# Patient Record
Sex: Female | Born: 1935 | Race: White | Hispanic: No
Health system: Southern US, Community
[De-identification: ages and names within clinical notes are randomized; demographics above are authoritative.]

---

## 2000-02-01 ENCOUNTER — Encounter (INDEPENDENT_AMBULATORY_CARE_PROVIDER_SITE_OTHER): Payer: Self-pay

## 2000-02-01 ENCOUNTER — Other Ambulatory Visit: Admission: RE | Admit: 2000-02-01 | Discharge: 2000-02-01 | Payer: Self-pay | Admitting: Obstetrics and Gynecology

## 2002-09-16 ENCOUNTER — Ambulatory Visit (HOSPITAL_COMMUNITY): Admission: RE | Admit: 2002-09-16 | Discharge: 2002-09-16 | Payer: Self-pay | Admitting: Internal Medicine

## 2003-02-24 ENCOUNTER — Encounter: Payer: Self-pay | Admitting: Internal Medicine

## 2003-02-24 ENCOUNTER — Ambulatory Visit (HOSPITAL_COMMUNITY): Admission: RE | Admit: 2003-02-24 | Discharge: 2003-02-24 | Payer: Self-pay | Admitting: Internal Medicine

## 2003-12-13 ENCOUNTER — Ambulatory Visit: Admission: RE | Admit: 2003-12-13 | Discharge: 2003-12-13 | Payer: Self-pay | Admitting: Gynecology

## 2003-12-27 ENCOUNTER — Encounter (INDEPENDENT_AMBULATORY_CARE_PROVIDER_SITE_OTHER): Payer: Self-pay | Admitting: Specialist

## 2003-12-27 ENCOUNTER — Inpatient Hospital Stay (HOSPITAL_COMMUNITY): Admission: RE | Admit: 2003-12-27 | Discharge: 2003-12-29 | Payer: Self-pay | Admitting: Gynecology

## 2004-01-03 ENCOUNTER — Ambulatory Visit: Admission: RE | Admit: 2004-01-03 | Discharge: 2004-01-03 | Payer: Self-pay | Admitting: Gynecology

## 2004-01-24 ENCOUNTER — Ambulatory Visit: Admission: RE | Admit: 2004-01-24 | Discharge: 2004-01-24 | Payer: Self-pay | Admitting: Gynecology

## 2004-01-25 ENCOUNTER — Ambulatory Visit: Admission: RE | Admit: 2004-01-25 | Discharge: 2004-04-24 | Payer: Self-pay | Admitting: Radiation Oncology

## 2004-02-22 ENCOUNTER — Ambulatory Visit: Admission: RE | Admit: 2004-02-22 | Discharge: 2004-02-22 | Payer: Self-pay | Admitting: Gynecology

## 2004-03-15 ENCOUNTER — Inpatient Hospital Stay (HOSPITAL_COMMUNITY): Admission: EM | Admit: 2004-03-15 | Discharge: 2004-03-20 | Payer: Self-pay | Admitting: Emergency Medicine

## 2004-04-04 ENCOUNTER — Ambulatory Visit: Admission: RE | Admit: 2004-04-04 | Discharge: 2004-04-04 | Payer: Self-pay | Admitting: Gynecology

## 2004-05-15 ENCOUNTER — Ambulatory Visit: Admission: RE | Admit: 2004-05-15 | Discharge: 2004-05-15 | Payer: Self-pay | Admitting: Radiation Oncology

## 2004-06-20 ENCOUNTER — Encounter (INDEPENDENT_AMBULATORY_CARE_PROVIDER_SITE_OTHER): Payer: Self-pay | Admitting: *Deleted

## 2004-06-20 ENCOUNTER — Ambulatory Visit: Admission: RE | Admit: 2004-06-20 | Discharge: 2004-06-20 | Payer: Self-pay | Admitting: Gynecology

## 2004-06-20 ENCOUNTER — Other Ambulatory Visit: Admission: RE | Admit: 2004-06-20 | Discharge: 2004-06-20 | Payer: Self-pay | Admitting: Gynecology

## 2004-08-21 ENCOUNTER — Ambulatory Visit: Admission: RE | Admit: 2004-08-21 | Discharge: 2004-08-21 | Payer: Self-pay | Admitting: Radiation Oncology

## 2004-09-07 ENCOUNTER — Ambulatory Visit: Admission: RE | Admit: 2004-09-07 | Discharge: 2004-09-07 | Payer: Self-pay | Admitting: Gynecology

## 2004-09-07 ENCOUNTER — Encounter (INDEPENDENT_AMBULATORY_CARE_PROVIDER_SITE_OTHER): Payer: Self-pay | Admitting: *Deleted

## 2004-10-24 ENCOUNTER — Ambulatory Visit: Admission: RE | Admit: 2004-10-24 | Discharge: 2004-10-24 | Payer: Self-pay | Admitting: Gynecology

## 2004-11-29 ENCOUNTER — Ambulatory Visit: Admission: RE | Admit: 2004-11-29 | Discharge: 2005-02-27 | Payer: Self-pay | Admitting: Radiation Oncology

## 2005-01-25 ENCOUNTER — Ambulatory Visit (HOSPITAL_COMMUNITY): Admission: RE | Admit: 2005-01-25 | Discharge: 2005-01-25 | Payer: Self-pay | Admitting: Internal Medicine

## 2005-01-29 ENCOUNTER — Other Ambulatory Visit: Admission: RE | Admit: 2005-01-29 | Discharge: 2005-01-29 | Payer: Self-pay | Admitting: Radiation Oncology

## 2005-02-07 ENCOUNTER — Encounter (HOSPITAL_COMMUNITY): Admission: RE | Admit: 2005-02-07 | Discharge: 2005-03-09 | Payer: Self-pay | Admitting: Radiation Oncology

## 2005-03-26 ENCOUNTER — Ambulatory Visit (HOSPITAL_COMMUNITY): Admission: RE | Admit: 2005-03-26 | Discharge: 2005-03-26 | Payer: Self-pay | Admitting: Radiation Oncology

## 2005-04-23 ENCOUNTER — Ambulatory Visit: Admission: RE | Admit: 2005-04-23 | Discharge: 2005-04-23 | Payer: Self-pay | Admitting: Gynecology

## 2005-04-23 ENCOUNTER — Encounter (INDEPENDENT_AMBULATORY_CARE_PROVIDER_SITE_OTHER): Payer: Self-pay | Admitting: *Deleted

## 2005-07-18 ENCOUNTER — Ambulatory Visit: Payer: Self-pay | Admitting: Psychiatry

## 2005-08-23 ENCOUNTER — Ambulatory Visit: Payer: Self-pay | Admitting: Psychiatry

## 2005-10-16 ENCOUNTER — Other Ambulatory Visit: Admission: RE | Admit: 2005-10-16 | Discharge: 2005-10-16 | Payer: Self-pay | Admitting: Radiation Oncology

## 2005-10-22 ENCOUNTER — Ambulatory Visit: Admission: RE | Admit: 2005-10-22 | Discharge: 2005-10-22 | Payer: Self-pay | Admitting: Gynecology

## 2005-11-05 ENCOUNTER — Encounter (INDEPENDENT_AMBULATORY_CARE_PROVIDER_SITE_OTHER): Payer: Self-pay | Admitting: *Deleted

## 2005-11-05 ENCOUNTER — Ambulatory Visit (HOSPITAL_COMMUNITY): Admission: RE | Admit: 2005-11-05 | Discharge: 2005-11-05 | Payer: Self-pay | Admitting: Gynecology

## 2005-11-19 ENCOUNTER — Ambulatory Visit: Admission: RE | Admit: 2005-11-19 | Discharge: 2005-11-19 | Payer: Self-pay | Admitting: Gynecologic Oncology

## 2005-12-11 ENCOUNTER — Ambulatory Visit: Admission: RE | Admit: 2005-12-11 | Discharge: 2005-12-11 | Payer: Self-pay | Admitting: Gynecology

## 2006-01-21 ENCOUNTER — Ambulatory Visit: Admission: RE | Admit: 2006-01-21 | Discharge: 2006-01-21 | Payer: Self-pay | Admitting: Gynecology

## 2006-03-04 ENCOUNTER — Ambulatory Visit: Admission: RE | Admit: 2006-03-04 | Discharge: 2006-03-04 | Payer: Self-pay | Admitting: Gynecologic Oncology

## 2006-03-25 ENCOUNTER — Ambulatory Visit: Payer: Self-pay | Admitting: Internal Medicine

## 2006-03-31 ENCOUNTER — Ambulatory Visit: Payer: Self-pay | Admitting: Internal Medicine

## 2006-03-31 ENCOUNTER — Ambulatory Visit (HOSPITAL_COMMUNITY): Admission: RE | Admit: 2006-03-31 | Discharge: 2006-03-31 | Payer: Self-pay | Admitting: Internal Medicine

## 2006-04-22 ENCOUNTER — Ambulatory Visit: Admission: RE | Admit: 2006-04-22 | Discharge: 2006-04-22 | Payer: Self-pay | Admitting: Gynecologic Oncology

## 2006-04-22 ENCOUNTER — Encounter (INDEPENDENT_AMBULATORY_CARE_PROVIDER_SITE_OTHER): Payer: Self-pay | Admitting: *Deleted

## 2006-06-17 ENCOUNTER — Ambulatory Visit: Admission: RE | Admit: 2006-06-17 | Discharge: 2006-06-17 | Payer: Self-pay | Admitting: Gynecologic Oncology

## 2006-06-17 ENCOUNTER — Encounter (INDEPENDENT_AMBULATORY_CARE_PROVIDER_SITE_OTHER): Payer: Self-pay | Admitting: *Deleted

## 2006-07-08 ENCOUNTER — Ambulatory Visit (HOSPITAL_COMMUNITY): Admission: RE | Admit: 2006-07-08 | Discharge: 2006-07-08 | Payer: Self-pay | Admitting: Gynecology

## 2006-07-08 ENCOUNTER — Encounter (INDEPENDENT_AMBULATORY_CARE_PROVIDER_SITE_OTHER): Payer: Self-pay | Admitting: *Deleted

## 2006-07-15 ENCOUNTER — Ambulatory Visit: Admission: RE | Admit: 2006-07-15 | Discharge: 2006-07-15 | Payer: Self-pay | Admitting: Gynecologic Oncology

## 2006-07-22 ENCOUNTER — Ambulatory Visit: Admission: RE | Admit: 2006-07-22 | Discharge: 2006-07-22 | Payer: Self-pay | Admitting: Gynecologic Oncology

## 2006-09-05 ENCOUNTER — Ambulatory Visit: Admission: RE | Admit: 2006-09-05 | Discharge: 2006-09-05 | Payer: Self-pay | Admitting: Gynecology

## 2006-12-03 ENCOUNTER — Ambulatory Visit: Admission: RE | Admit: 2006-12-03 | Discharge: 2006-12-03 | Payer: Self-pay | Admitting: Gynecology

## 2007-02-20 ENCOUNTER — Ambulatory Visit: Admission: RE | Admit: 2007-02-20 | Discharge: 2007-02-20 | Payer: Self-pay | Admitting: Gynecology

## 2007-02-25 ENCOUNTER — Ambulatory Visit (HOSPITAL_COMMUNITY): Admission: RE | Admit: 2007-02-25 | Discharge: 2007-02-25 | Payer: Self-pay | Admitting: Gynecologic Oncology

## 2007-02-25 ENCOUNTER — Encounter (INDEPENDENT_AMBULATORY_CARE_PROVIDER_SITE_OTHER): Payer: Self-pay | Admitting: Specialist

## 2007-03-09 ENCOUNTER — Ambulatory Visit: Admission: RE | Admit: 2007-03-09 | Discharge: 2007-05-21 | Payer: Self-pay | Admitting: Radiation Oncology

## 2007-03-18 ENCOUNTER — Ambulatory Visit (HOSPITAL_COMMUNITY): Admission: RE | Admit: 2007-03-18 | Discharge: 2007-03-18 | Payer: Self-pay | Admitting: Radiation Oncology

## 2007-04-07 ENCOUNTER — Ambulatory Visit (HOSPITAL_COMMUNITY): Admission: RE | Admit: 2007-04-07 | Discharge: 2007-04-07 | Payer: Self-pay | Admitting: Radiation Oncology

## 2007-08-27 IMAGING — CT CT ABDOMEN W/ CM
2 of 5 series · 16 of 46 positions shown, 18 images · IV contrast (omnipaque)
Comparison: None.

CLINICAL DATA: Evaluate for recurrent vulvar CA.  Reason for exam:  Question mets to abdomen/pelvis.  
ABDOMEN CT WITH CONTRAST:
TECHNIQUE: Multidetector CT imaging of the abdomen was performed following the standard protocol during bolus administration of intravenous contrast.
Contrast:  125 cc Omnipaque 300 and oral gastrografin.
TECHNIQUE: Multidetector CT imaging of the pelvis was performed following the standard protocol during bolus administration of intravenous contrast.

[Series 2: abd_pel 5.0 b40s · axial · 0.74mm/px · z∈[-434,-39]mm · 13 of 89 slices shown, 15 images]
[im 5/89  soft-tissue]
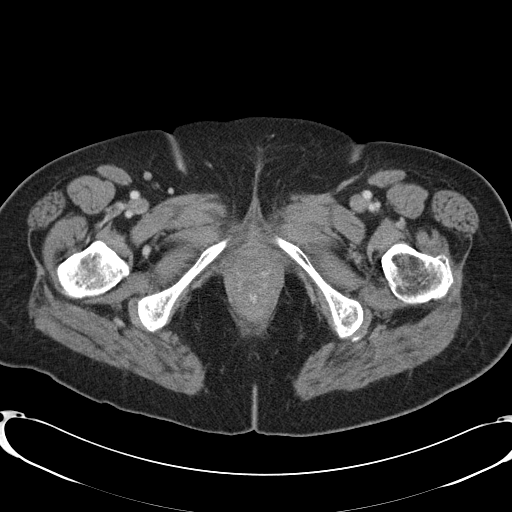
[im 5/89  bone]
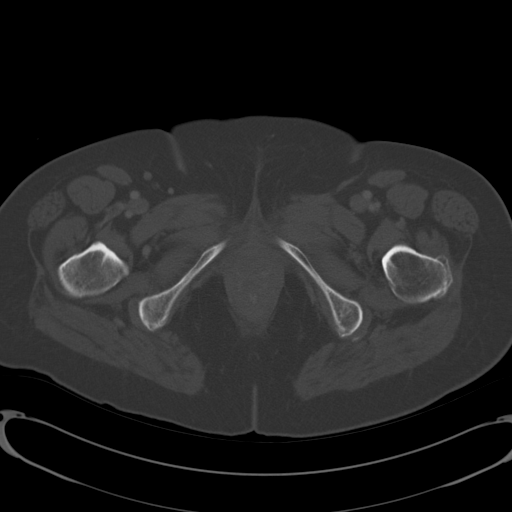
[im 14/89  soft-tissue]
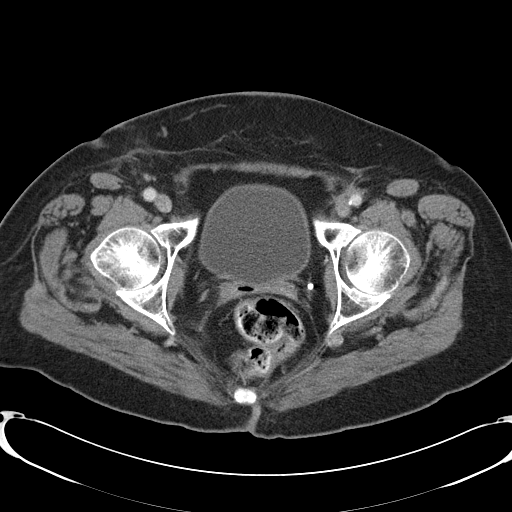
[im 19/89  soft-tissue]
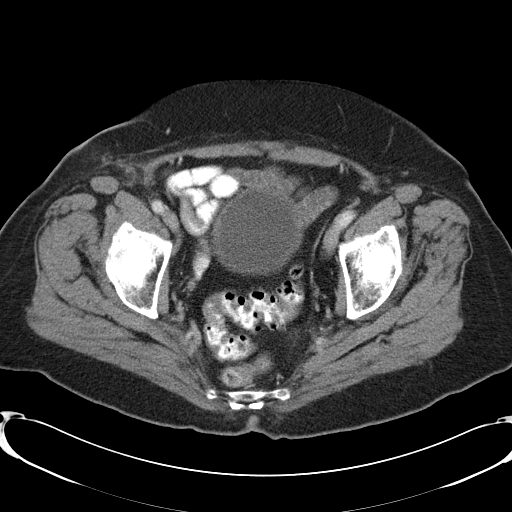
[im 24/89  soft-tissue]
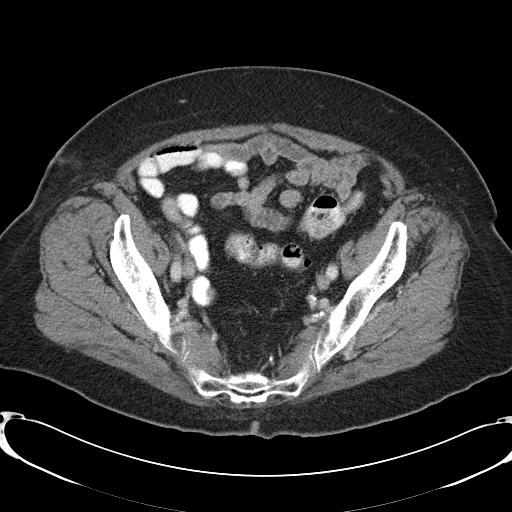
[im 33/89  soft-tissue]
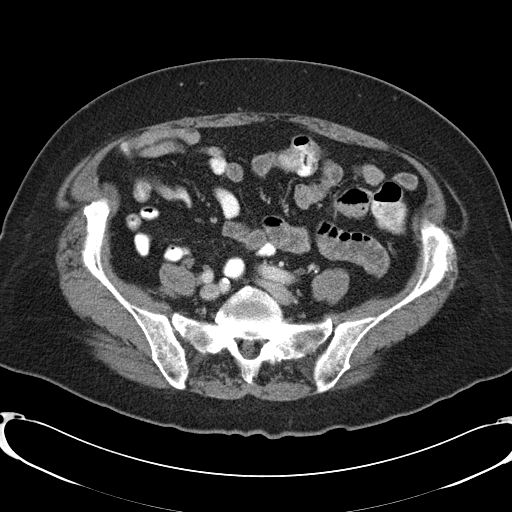
[im 38/89  soft-tissue]
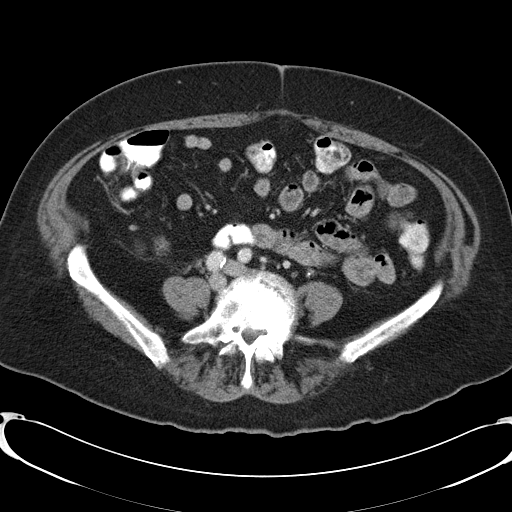
[im 47/89  soft-tissue]
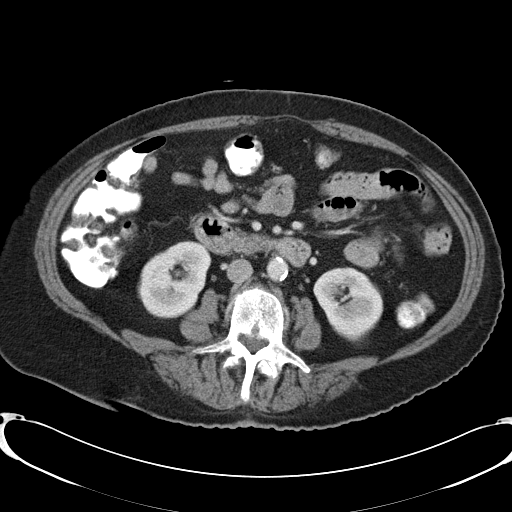
[im 51/89  soft-tissue]
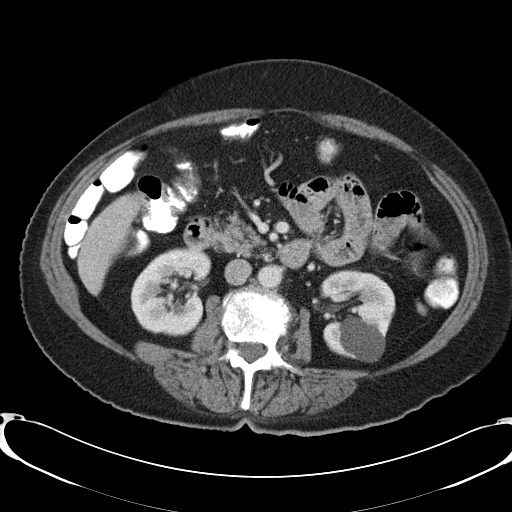
[im 56/89  soft-tissue]
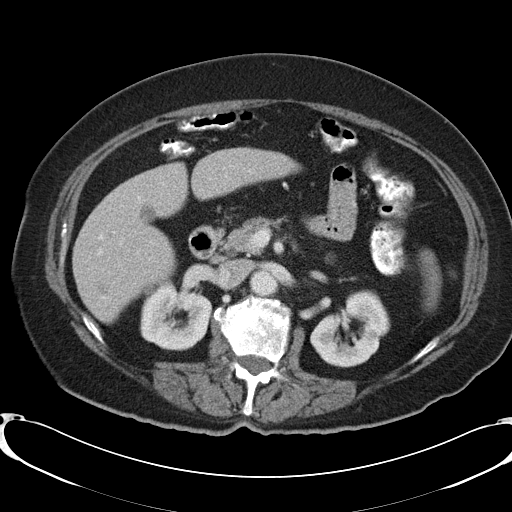
[im 56/89  bone]
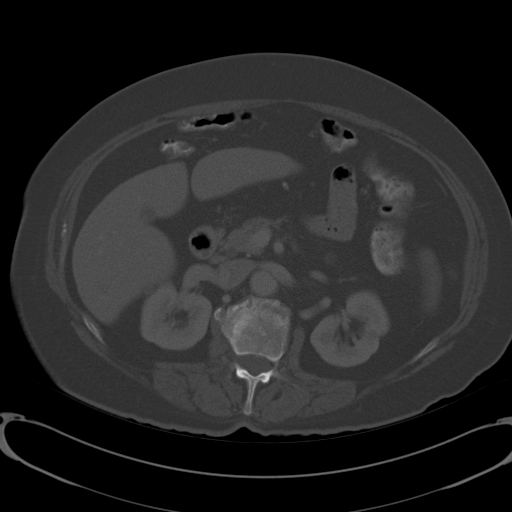
[im 65/89  soft-tissue]
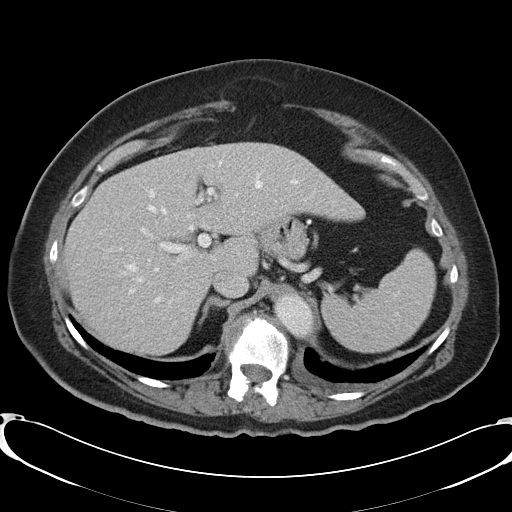
[im 70/89  soft-tissue]
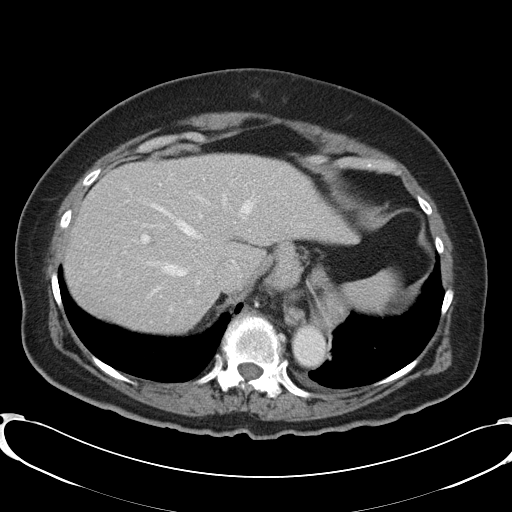
[im 75/89  soft-tissue]
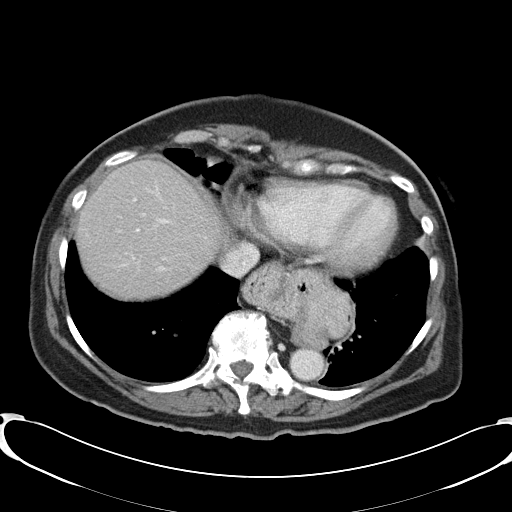
[im 84/89  soft-tissue]
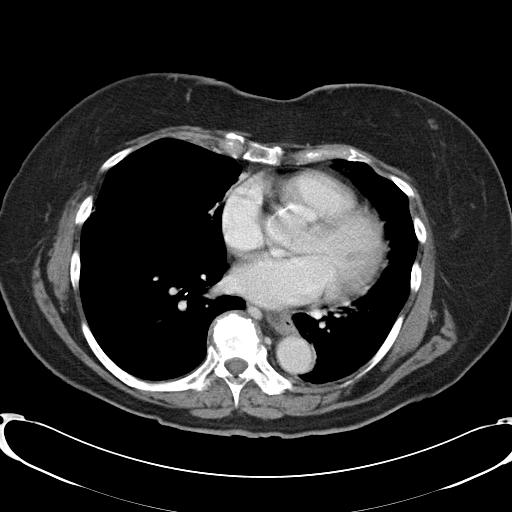

[Series 602: <mpr thick range> · coronal · 0.87mm/px · 3 of 78 slices shown]
[im 26/78  soft-tissue]
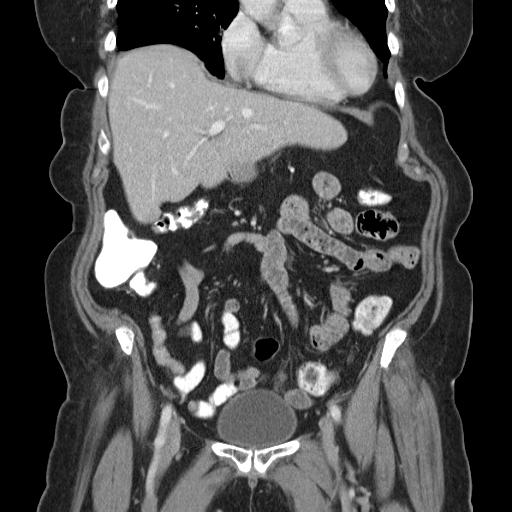
[im 35/78  soft-tissue]
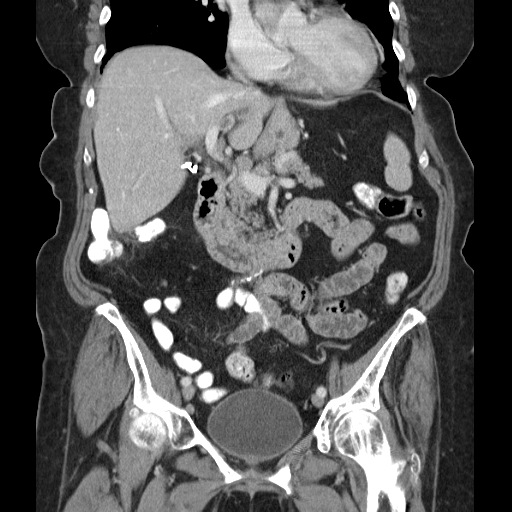
[im 43/78  soft-tissue]
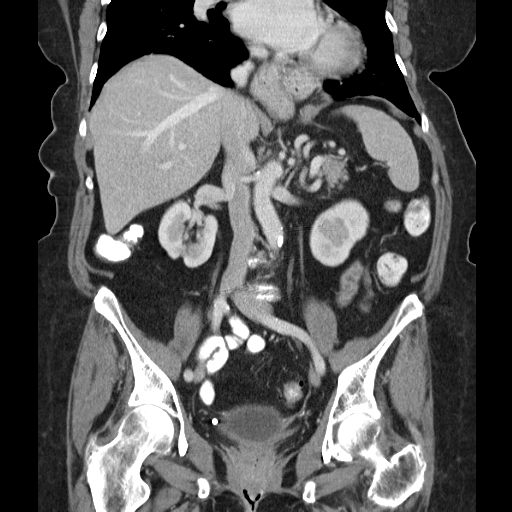

[16 of 46 positions shown; findings below may reference images not displayed]

FINDINGS: Small pleural effusion left posterior costophrenic angle.  Large hiatal hernia.  Enlarged node(s) in the porta hepatis just anteromedial to the portal vein with a short axis diameter of 1.3 cm.  No evidence of retroperitoneal adenopathy and no evidence of pelvic adenopathy (see pelvic CT report) which would be somewhat unusual for metastatic disease from vulvar CA, however metastatic disease cannot be excluded with certainty.  Hyperplastic node is a consideration.  1.5 cm cyst in the inferior aspect of the posterior segment of the right hepatic lobe.  In the posterior lateral aspect of the medial segment of the left hepatic lobe (see transaxial images 32 ? 35) there is an approximate 1.6 x 1.7 x 1.9 cm low density focus which does not represent a cyst.  Major differential considerations are liver metastasis versus focal fatty infiltration simulating a lesion.  Consider MRI for further assessment in that regard.  Spleen, pancreas, adrenal glands, and kidneys are unremarkable.  2.8 x 4.0 cm cyst posterior aspect of the left kidney.   Extensive degenerative spondylotic changes lower thoracic and lumbar spine.  Negative for metastatic bone disease.
IMPRESSION: Porta hepatis region adenopathy.  Metastatic disease cannot be excluded ? see comments above.  Focal hypodense focus in the liver ? major considerations are metastatic disease versus focal fatty infiltration of the liver.  If clinically desired, MRI would be helpful for further imaging assessment.  
PELVIS CT WITH CONTRAST:
FINDINGS: Hysterectomy.  Mild diffuse bladder wall thickening may be due to chronic/remote cystitis.  Sigmoid colon diverticula.  No pathologically enlarged pelvic lymph nodes.  Bony pelvis intact.
IMPRESSION: No pelvic mass or adenopathy.  Negative for metastatic disease or recurrent/residual carcinoma of the pelvis.

## 2007-11-06 ENCOUNTER — Ambulatory Visit (HOSPITAL_COMMUNITY): Admission: RE | Admit: 2007-11-06 | Discharge: 2007-11-06 | Payer: Self-pay | Admitting: Gynecology

## 2007-11-10 ENCOUNTER — Ambulatory Visit: Admission: RE | Admit: 2007-11-10 | Discharge: 2007-11-10 | Payer: Self-pay | Admitting: Gynecologic Oncology

## 2007-11-10 ENCOUNTER — Encounter (INDEPENDENT_AMBULATORY_CARE_PROVIDER_SITE_OTHER): Payer: Self-pay | Admitting: Gynecologic Oncology

## 2007-11-16 ENCOUNTER — Ambulatory Visit (HOSPITAL_COMMUNITY): Admission: RE | Admit: 2007-11-16 | Discharge: 2007-11-16 | Payer: Self-pay | Admitting: Radiation Oncology

## 2007-12-08 ENCOUNTER — Encounter: Payer: Self-pay | Admitting: Gynecology

## 2007-12-08 ENCOUNTER — Ambulatory Visit (HOSPITAL_COMMUNITY): Admission: RE | Admit: 2007-12-08 | Discharge: 2007-12-08 | Payer: Self-pay | Admitting: Gynecology

## 2008-01-19 ENCOUNTER — Ambulatory Visit: Admission: RE | Admit: 2008-01-19 | Discharge: 2008-01-19 | Payer: Self-pay | Admitting: Gynecology

## 2008-02-26 ENCOUNTER — Inpatient Hospital Stay (HOSPITAL_COMMUNITY): Admission: EM | Admit: 2008-02-26 | Discharge: 2008-03-01 | Payer: Self-pay | Admitting: Emergency Medicine

## 2008-02-27 ENCOUNTER — Ambulatory Visit: Payer: Self-pay | Admitting: Critical Care Medicine

## 2008-02-27 ENCOUNTER — Ambulatory Visit: Payer: Self-pay | Admitting: Cardiology

## 2008-02-28 ENCOUNTER — Encounter: Payer: Self-pay | Admitting: Pulmonary Disease

## 2008-02-29 ENCOUNTER — Encounter (INDEPENDENT_AMBULATORY_CARE_PROVIDER_SITE_OTHER): Payer: Self-pay | Admitting: Pulmonary Disease

## 2008-02-29 ENCOUNTER — Ambulatory Visit: Payer: Self-pay | Admitting: Infectious Diseases

## 2011-01-12 ENCOUNTER — Encounter: Payer: Self-pay | Admitting: Internal Medicine

## 2011-01-13 ENCOUNTER — Encounter: Payer: Self-pay | Admitting: Internal Medicine

## 2011-05-07 NOTE — Consult Note (Signed)
[name], THORNELL            ACCOUNT NO.:  [id number]   MEDICAL RECORD NO.:  [id number]          PATIENT TYPE:  OUT   LOCATION:  GYN                          FACILITY:  [facility]   PHYSICIAN:  John T. Kyla Balzarine, M.D.    DATE OF BIRTH:  12/14/1927   DATE OF CONSULTATION:  11/10/2007  DATE OF DISCHARGE:                                 CONSULTATION   CHIEF COMPLAINT:  Followup of vulvar cancer with concern for new vulvar  lesion.   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  She had left inguinal radiation including the vulva and a whole pelvic  field.  She had radiation vulvitis.  In November 2006, she had a right  vulvar recurrence.  Following resection, she had vulvar wound breakdown.  Recurrence in May 2007 in the left posterior vulva and right vulva were  treated with resections, but inguinal lymphadenectomy was not performed.  She subsequently developed an enlarged right groin node which was  positive on resection and received right groin radiation.  She underwent  restaging MRI scans because of incidentally noted portal nodes and a  lung nodule and complained to [place] yesterday that she had noted a new  lesion of the right anterior vulva that was uncomfortable.   PAST MEDICAL HISTORY:  1. Hypercholesterolemia.  2. Hypertension.  3. Adult onset diabetes.   ALLERGIES:  None   MEDICATIONS:  Atenolol, Xanax, unknown antihypertensive   FAMILY HISTORY:  Noncontributory without gynecologic, breast or colon  cancer.   PERSONAL AND SOCIAL HISTORY:  Widowed and denies tobacco or significant  ethanol use.   REVIEW OF SYSTEMS:  On 10-point Review of Systems, the patient does have  chronic diarrhea.  She is extremely anxious over the findings on today's  examination.  Otherwise, essentially negative, with stable  weight and  appetite.   PHYSICAL EXAMINATION:  VITAL SIGNS:  Weight 176 pounds. Blood pressure  118/80.  GENERAL:  The patient is anxious, alert and oriented x3 in no acute  distress.  LYMPH:  No palpably enlarged lymph nodes on lymph node survey.  ABDOMEN:  Soft and benign.  BACK:  No spinous or CVA tenderness.  EXTREMITIES:  Chronic edema bilaterally.  PELVIC:  Examination the groins and vulva reveals chronic radiation  changes and hyperkeratosis in the crural folds.  In the right anterior  vulva at approximately the level of the urethra at the clitoris, mid  labia majora is a suspicious 2 cm lesion measuring at least 5 mm in  thickness.  After informed consent, this was injected with Xylocaine,  and a punch biopsy obtained and submitted for pathology.   ASSESSMENT:  Likely recurrent vulvar cancer.   PLAN:  The patient will likely require wide local excision.      John T. Kyla Balzarine, M.D.  Electronically Signed     JTS/MEDQ  D:  11/10/2007  T:  11/11/2007  Job:  [id number]   cc:   Kingsley Callander. Ouida Sills, MD  Fax:  [phone]   S. Kyra Manges, M.D.  Fax: [phone]   Billie Lade, M.D.  Fax: [phone]   Telford Nab, R.N.  [address]  [place], [place] [address]

## 2011-05-07 NOTE — H&P (Signed)
[name], [name]            ACCOUNT NO.:  [id number]   MEDICAL RECORD NO.:  [id number]          PATIENT TYPE:  INP   LOCATION:  IC07                          FACILITY:  [facility]   PHYSICIAN:  Kingsley Callander. Ouida Sills, MD       DATE OF BIRTH:  27-Feb-1927   DATE OF ADMISSION:  02/25/2008  DATE OF DISCHARGE:  LH                              HISTORY & PHYSICAL   CHIEF COMPLAINT:  Vomiting and diarrhea.   HISTORY OF PRESENT ILLNESS:  This patient is a 77 year old white female  resident of [facility] who was transferred to the emergency room last night  after she developed a low blood pressure.  She was initially hypotensive  in the ER.  Blood pressure was measured at 92/47 at the ER and then  dropped to 67/42.  She was febrile to 102.7.  She had had multiple bouts  of loose stools.  She had been treated last month for Clostridium  difficile associated colitis with Flagyl.  She had been diagnosed with a  recurrence earlier this week.  She was started on IV fluids at the  nursing home along with Flagyl on March 3.  She had been transferred to  [facility] in January following a bilateral valvulectomy with a rhomboid  flap for recurrent vulvar carcinoma.  She had received cephalexin over  that period.  She was evaluated in the emergency room with a CT scan  which revealed severe colitis.  There is no definite evidence of  perforation.  She was admitted and ultimately transferred to the  intensive care unit due to hypotension.  She was treated with IV fluids.  She was treated with dopamine and then Levophed.  She had a marked  leukocytosis.   PAST MEDICAL HISTORY:  1. Vulvar carcinoma  2. Coronary artery disease  3. Hypertension  4. Hyperlipidemia  5. Impaired fasting glucose.  6. Gastroesophageal reflux disease.  7. Mild dementia.  8. Depression.  9. Anxiety with panic attacks.  10.Hysterectomy  11.Hiatal hernia  12.Cholecystectomy.   MEDICATIONS:  1. Atenolol 50 mg daily  2. Celexa 40 mg daily  3. Vasotec 10 mg daily  4. Omeprazole 20 mg daily  5. Multivitamin daily  6. Simvastatin 80 mg daily  7. Trazodone 100 mg q.h.s.  8. Imodium A-D p.r.n.  9. Pro-Stat 30 mL b.i.d.   ALLERGIES:  None.   SOCIAL HISTORY:  She does not use tobacco or alcohol.   FAMILY HISTORY:  Her mother had Alzheimer's disease and COPD.  Her  father had prostate cancer.   REVIEW OF SYSTEMS:  She has been able to eat or drink very little over  the last several days.  She has vomited several times and has felt  nauseated.  She denies abdominal pain.  She has had multiple loose  stools.  She has not had bleeding.  She has had progressive wound  healing and is no longer requiring any packing of her vulvar area.   PHYSICAL EXAM:  Blood pressure 105/70, pulse 110, maximal temperature  102.7, respirations 24.  GENERAL:  Alert and oriented and in no acute distress.  She appears very  pale.  HEENT: No scleral icterus.  She has a benign skin mass on the right chin  which is chronic.  Oropharynx reveals fair moisture.  She has an upper  denture.  NECK: No JVD or thyromegaly or lymphadenopathy.  LUNGS: Clear.  HEART:  Tachycardia with a grade 1 systolic murmur.  ABDOMEN: Soft and nontender with no hepatosplenomegaly.  GU: She has a Foley catheter which is showing some wear and is being  replaced. EXTREMITIES:  No cyanosis, clubbing or edema  NEURO:  Generally weak.  No focal deficits  LYMPH NODES:  No cervical or supraclavicular enlargement.   LABORATORY DATA:  White count 26.3, hemoglobin 12.4, platelets 384,000,  86 segs, seven lymphs with greater than 20% bands.  Sodium 134,  potassium 3.5, bicarb 24, glucose 162, BUN 17, creatinine 1.49, calcium  8.1, lipase 16, albumin 1.9, SGOT 20, SGPT 8.  Chest x-ray reveals a suboptimal inspiration with bibasilar atelectasis,  large hiatal hernia and stable mild cardiomegaly.   IMPRESSION:  1. Clostridium difficile colitis.  She is being treated with IV fluids       and Levophed.  She will be treated with oral vancomycin and IV      metronidazole.  Hopefully she will respond to antibiotic therapy      but if necessary surgical consultation will be obtained.  She has      severe colitis by CT.  2. Vulvar carcinoma followed by Dr. Loree Fee.  3. Coronary artery disease.  4. Hypertension  5. Hyperlipidemia.  6. GERD.  7. Depression/anxiety.      Kingsley Callander. Ouida Sills, MD  Electronically Signed    ROF/MEDQ  D:  02/26/2008  T:  02/26/2008  Job:  [id number]

## 2011-05-07 NOTE — Consult Note (Signed)
NAMEHARMONIE, [name] NO.:  [id number]   MEDICAL RECORD NO.:  [id number]          PATIENT TYPE:  OUT   LOCATION:  GYN                          [facility]ACILITY:  [facility]   PHYSICIAN:  De Blanch, M.D.DATE OF BIRTH:  29-May-1927   DATE OF CONSULTATION:  01/19/2008  DATE OF DISCHARGE:  01/19/2008                                 CONSULTATION   CHIEF COMPLAINT:  Postoperative follow-up.   INTERVAL HISTORY:  The patient returns today for further evaluation of  her vulvar wound.  She has been having this packed at the nursing home  and seems to be tolerating the packing well.  She denies any fever,  chills or any vaginal bleeding.  She was seen 2 weeks ago by Dr. Kyla Balzarine.   PHYSICAL EXAM:  Weight 165 pounds.  ABDOMEN:  Soft, nontender.  There is no inguinal adenopathy.  PELVIC:  EGBUS shows that the area of wound breakdown is granulating  well and is only approximately 3 x 2 cm.  There is no evidence of  erythema or infection.   IMPRESSION:  Continued improvement of her vulvar wound. She will  continue dressing changes as is being performed at the nursing home.  She will return to see [place] in approximately 3-4 weeks.      De Blanch, M.D.  Electronically Signed     DC/MEDQ  D:  01/21/2008  T:  01/21/2008  Job:  [id number]

## 2011-05-07 NOTE — Discharge Summary (Signed)
[name], [name]            ACCOUNT NO.:  [id number]   MEDICAL RECORD NO.:  [id number]           PATIENT TYPE:  INP   LOCATION:  IC07                          FACILITY:  [facility]   PHYSICIAN:  Kingsley Callander. Ouida Sills, MD       DATE OF BIRTH:  [birth year]   DATE OF ADMISSION:  02/26/2008  DATE OF DISCHARGE:  03/07/2009LH                               DISCHARGE SUMMARY   DISCHARGE DIAGNOSES:  1. Clostridium difficile colitis.  2. Acute renal failure.  3. Shock.  4. Metabolic acidosis.  5. Vulvar carcinoma.  6. Coronary artery disease.  7. Hypertension.  8. Hyperlipidemia.  9. Impaired fasting glucose.  10.Gastroesophageal reflux disease.  11.Mild dementia.  12.Depression/anxiety.   PROCEDURES:  1. PICC line placement.  2. CT of the abdomen and pelvis.   HOSPITAL COURSE:  This patient is a 77 year old female who was admitted  to the ICU with hypotension, diarrhea, nausea and vomiting.  She had  been treated at her nursing home for Clostridium difficile colitis with  Flagyl.  She had become hypotensive though.  She was treated with IV  fluids and Levophed.  Despite large volumes of fluids, she has had  minimal urine output with associated acute renal failure with a rise in  her creatinine from 1.4 to 2.2.  She had a metabolic acidosis with a pH  of 7.35, pCO2 of 27, pO2 of 68 and a bicarbonate of 14 on 50% FIO2.   A repeat Clostridium difficile toxin is positive.   Assistance has been received from [facility] physicians.  The decision has  now been made to transfer her to [facility] for additional critical care  assistance.  She may require a surgical consultation and a repeat CT  scan of the abdomen.  The initial CT scan without contrast revealed  severe colitis, but no sign of perforation.   Blood cultures are negative.   The patient remains alert and oriented.  Blood pressures have been low  normal now on 11 mcg of Levophed.   DISCHARGE MEDICATIONS:  1. Vancomycin 125 mg p.o. q.6h.  2.  Flagyl 500 mg IV q.8h.  3. Lovenox 40 mg q.24h.  4. Celexa 40 mg daily.  5. Multivitamin daily.  6. Protonix 40 mg IV q.24h.  7. Bicarbonate drip with 3 ampules of bicarbonate in D5W at 100 cc an      hour.  8. She is also receiving normal saline at 100 cc an hour.  9. Zofran 4 mg IV q.6h. p.r.n.  10.Levophed 11 mcg.      Kingsley Callander. Ouida Sills, MD  Electronically Signed     ROF/MEDQ  D:  02/27/2008  T:  02/29/2008  Job:  [id number]

## 2011-05-07 NOTE — Op Note (Signed)
[name], [name]            ACCOUNT NO.:  [id number]   MEDICAL RECORD NO.:  [id number]          PATIENT TYPE:  AMB   LOCATION:  DAY                          FACILITY:  [facility]   PHYSICIAN:  De Blanch, M.D.DATE OF BIRTH:  June 05, 1927   DATE OF PROCEDURE:  12/08/2007  DATE OF DISCHARGE:                               OPERATIVE REPORT   PREOPERATIVE DIAGNOSIS:  Recurrent vulvar carcinoma.   POSTOPERATIVE DIAGNOSIS:  Recurrent vulvar carcinoma.   PROCEDURE:  Bilateral partial vulvectomy, bilateral rhomboid flap  reconstruction.   SURGEON:  De Blanch, M.D.   FIRST ASSISTANT:  Telford Nab, R.N.   ANESTHESIA:  General with orotracheal tube.   ESTIMATED BLOOD LOSS:  50 mL.   SURGICAL FINDINGS:  Examination under anesthesia revealed an exophytic  lesion of the right mid-vulva measuring approximately 3.5 x 3 cm.  There  was an additional lesion on the left vulva measuring approximately 1.5  cm.  There was also a small nodule on the anterior vulva measuring  approximately 5 mm.   PROCEDURE:  The patient was brought to the operating room and after  satisfactory attainment of general anesthesia was placed in the modified  lithotomy position in [place] stirrups.  The perineum, vagina, and vulva  were prepped with Betadine, and the patient was draped.   The left vulvar lesion was identified and a 1 cm margin established  circumferentially.  This incision was made with a skin knife and carried  down into the subcutaneous fat.  Using the Bovie, the entire lesion was  excised and submitted to pathology oriented on cork board.  A similar  procedure was performed on the right vulvar lesion.  The margin extended  immediately adjacent to the urethra which would be most likely the  closest surgical margin.  The remainder of the margins were felt to be  at least 1 cm.  Likewise, the excision extended into the subcutaneous  fat.  A small nodule on the anterior vulva  was then excised.  This area  was closed with 3-0 Vicryl suture.  The larger vulvar defects were in a  previously radiated field and could not be closed primarily.  Rhomboid  flaps were created from the medial thigh and mobilized into the defect.  These were then secured using interrupted 2-0 Vicryl sutures, most in a  mattress suture.  Hemostasis was excellent.  The flaps seemed to be  under no significant amount of tension at the completion of the surgical  procedure.   The patient was awakened from anesthesia and taken to the recovery room  in satisfactory condition.  Sponge, needle and instrument counts were  correct x2.      De Blanch, M.D.  Electronically Signed     DC/MEDQ  D:  12/08/2007  T:  12/08/2007  Job:  [id number]   cc:   Telford Nab, R.N.  [address]  [place], [place] [address]   Kingsley Callander. Ouida Sills, MD  Fax: [phone]   Billie Lade, M.D.  Fax: [phone]   S. Kyra Manges, M.D.  Fax: [phone]

## 2011-05-07 NOTE — Consult Note (Signed)
[name], [name]            ACCOUNT NO.:  [id number]   MEDICAL RECORD NO.:  [id number]           PATIENT TYPE:   LOCATION:                                 FACILITY:   PHYSICIAN:  [name], M.D.    DATE OF BIRTH:  12-09-1927   DATE OF CONSULTATION:  01/05/2008  DATE OF DISCHARGE:                                 CONSULTATION   CHIEF COMPLAINT:  Wound inspection.   CHIEF COMPLAINT:  This 77 year old woman underwent radical local  resection of recurrent vulvar cancer with bilateral partial vulvectomy  and bilateral rhomboid flaps on December 08, 2007.  Final pathology  revealed invasive well-differentiated squamous cell carcinoma with  ulceration.  On the left side, there was a positive lateral margin.  Unfortunately, rhomboid flaps broke down and were necrotic requiring  admission to [facility] from December 22 through December 29.  During this  hospitalization she underwent debridement and intensive wound care.  She  was treated initially with Zosyn and Flagyl and was discharged on  Keflex.  She has been residing at the [facility] facility in  [place] where she has been undergoing local wound care with  hydrotherapy and packing t.i.d. and following each bowel movement.  She  has an indwelling Foley catheter.  Concern was raised because a culture  from the wound or the vagina returned methicillin-resistant staph.  She  denies fever or chills and is beginning to ambulate.  She has minimal  pain requirements.   EXAMINATION:  The posterior vulvar wound defect has a very nice bed of  granulation tissue with no areas of necrosis or cellulitis.  The wound  was redressed with moist coarse mesh gauze.   ASSESSMENT:  Vulvectomy breakdown following resection of recurrent  vulvar cancer and a previously radiated field.   PLAN:  Continue local wound care with packing and Foley catheter for at  least another couple of weeks, at which time she will be seen by Dr.  Loree Fee.   The patient has MRSA colonization which is not unusual,  given her history of exposure to high potency antibiotics for a  prolonged period of time in a hospital and subsequently a nursing home  facility.  At present, there is no indication to treat this with any  antibiotic therapy.  Contact isolation  procedures should be used by health care workers caring for the patient,  with good hand washing technique, gowns and gloves.  Indications for  antibiotic therapy would be the development of cellulitis with fever and  chills or lesions suspicious of an abscess.      [name], M.D.  Electronically Signed     JTS/MEDQ  D:  01/05/2008  T:  01/05/2008  Job:  [id number]   cc:   Kingsley Callander. Ouida Sills, MD  Fax: [phone]   S. Kyra Manges, M.D.  Fax: [phone]   Billie Lade, M.D.  Fax: [phone]

## 2011-05-10 NOTE — Consult Note (Signed)
NAMECHEZNEY, HUETHER NO.:  [id number]   MEDICAL RECORD NO.:  [id number]          PATIENT TYPE:  OUT   LOCATION:  GYN                          FACILITY:  [facility]   PHYSICIAN:  De Blanch, M.D.DATE OF BIRTH:  [birth year]   DATE OF CONSULTATION:  DATE OF DISCHARGE:                                   CONSULTATION   A 77 year old white female returns for continued followup of stage III  squamous cell carcinoma of the vulva.   INTERVAL HISTORY:  Since her last visit, the patient has been using Temovate  and Silvadene cream on her vulva.  The open area on the posterior vulva has  nearly completely healed.  She has different areas that are pruritic and  sometimes thickened but they seem to resolve and move to another area.  She  denies any vaginal discharge or any vulvar bleeding.   HISTORY OF PRESENT ILLNESS:  Patient had a modified left radical vulvectomy  and left inguinal and femoral lymphadenectomy in January, 2005.  She had one  of 13 positive nodes and received full pelvis radiation and inguinal  radiation therapy.  She has been followed since then with no evidence of  recurrent disease.   PAST MEDICAL HISTORY:  1.  Medical illnesses:  Hypertension, obesity.  2.  Past surgical history:  Ventral hernia repair, cholecystectomy, hiatal      hernia repair, hysterectomy, radical vulvectomy with inguinal      lymphadenectomy (left).   CURRENT MEDICATIONS:  Atenolol, Zoloft, Xanax, Celexa, Altace.   DRUG ALLERGIES:  None.   FAMILY HISTORY:  Negative for gynecologic, breast, or colon cancer.   REVIEW OF SYSTEMS:  Negative except as noted above.  Her primary symptoms  are that related to her vulva.   PHYSICAL EXAMINATION:  VITAL SIGNS:  Weight 178 pounds.  ABDOMEN:  Obese, soft and nontender.  No mass, organomegaly, ascites, or  hernias are noted.  Marland Kitchen  PELVIC:  EG/BUS shows changes consistent with lichen sclerosis in the  anterior vulva posteriorly.   Midline at the fourchette is a small area which  is granulating.  This is much smaller than on previous exam.  To the right  is an area of erythema.  There is no ulceration or thickening of this area.  The anus itself appears normal.   IMPRESSION:  Continued vulvar changes secondary to radiation therapy.  We  will have the patient continue using Silvadene on the open perineal area.  She will use this twice a day.  In addition, she has been given a  prescription for Domeboro solution to be applied to the vulvar skin twice  daily.  She will return to see [place] in three months for continuing followup.     [name]   DC/MEDQ  D:  10/24/2004  T:  10/24/2004  Job:  [id number]   cc:   Telford Nab, R.N.  [address]  [place], [place] [address]   Kingsley Callander. Ouida Sills, MD  [address]  [place]  [place] [address]  Fax: [phone]   S. Kyra Manges, M.D.  [phone] [address]  [place]  [place] [address]  Fax: [phone]   Billie Lade, M.D.  [address] - [facility]  [place]  [place] [address]  Fax: [phone]

## 2011-05-10 NOTE — H&P (Signed)
NAME:  [name], [name]            ACCOUNT NO.:  [id number]   MEDICAL RECORD NO.:  [id number]          PATIENT TYPE:  AMB   LOCATION:                                FACILITY:  [facility]   PHYSICIAN:  Lionel December, M.D.         DATE OF BIRTH:   DATE OF ADMISSION:  DATE OF DISCHARGE:  LH                                HISTORY & PHYSICAL   CHIEF COMPLAINT:  Diarrhea and rectal bleeding.   HISTORY OF PRESENT ILLNESS:  [name] is a 77 year old Caucasian female  who has had several episodes of perfuse watery diarrhea. She has also been  passing bright red blood with her stools. She has postprandial epigastric  pain and postprandial fecal urgency. She occasionally has fecal seepage. She  also has alternating constipation every 2-3 days. She can go without a bowel  movement. She complains of bilateral lower quadrant cramp like abdominal  pain. She complains of epigastric aching and low back pain. She constantly  feels bloated and gassy. She is using Imodium p.r.n. for her diarrhea. She  has a history of indigestion, regurgitation, intermittent nausea, denies any  vomiting. She has been on Prilosec for years. She denies any early satiety,  dysphagia, or odynophagia. She has a history of erosive reflux  esophagitis/gastritis and Cameron ulcer back in 2003. She states her  symptoms are similar to when she had this. I do not see where she has had an  serum H. pylori checked.   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 She had one positive node out of 13, she  received whole pelvis and left inguinal radiation. 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 She is being followed conservatively. She had chronic radiation  vulvitis. She has a history of Cameron ulcer, erosive esophagitis/gastritis  on EGD in September of 2003. H. pylori status unknown. She has a history of  coronary artery disease, osteoarthritis, hypertension, depression, anxiety.  She is status post appendectomy, tonsillectomy, cholecystectomy,  hysterectomy, and she has a history of chronic back pain. She had a total  colonoscopy on November 11, 2000. She had a few diverticula at the sigmoid  colon, multiple small polyps with biopsy consistent with hyperplastic  polyps.   CURRENT MEDICATIONS:  1.  Atenolol unknown dose daily.  2.  Altace 10 mg daily.  3.  Xanax 1 mg at bedtime.  4.  Celexa 10 mg daily.  5.  Zoloft unknown dose daily.  6.  Prilosec 20 mg daily.   ALLERGIES:  No known drug allergies.   FAMILY HISTORY:  No known family history of colorectal carcinoma, liver or  chronic GI problems. Mother deceased at age 19 secondary to COPD and  coronary artery disease. Father deceased at age 58 secondary to metastatic  prostate carcinoma.   SOCIAL HISTORY:  [name] is widowed, she has  one grown healthy son, she  has been a Futures trader. She denies any tobacco or drug use. She has an  occasional glass of wine.   REVIEW OF SYSTEMS:  CONSTITUTIONAL:  Her weight is daily increasing. She  denies any fever or chills and denies any fatigue. CARDIOVASCULAR:  Denies  any chest pain or palpitations.  PULMONARY:  Denies any shortness of breath,  dyspnea, cough or hemoptysis. GI:  See HPI. GU:  She does have frequent  urinary incontinence, denies any dysuria or hematuria.   PHYSICAL EXAMINATION:  VITAL SIGNS:  Weight 175 pounds, height 64 inches,  temp 98.4, blood pressure 154/80, pulse 68.  GENERAL:  [name] is a 77 year old Caucasian female who is alert,  pleasant and cooperative, in no acute distress.  HEENT:  Sclera clear, nonicteric conjunctiva. Oropharynx pink and moist  without any lesions.  NECK:  Supple without mass or  thyromegaly.  CHEST:  Heart regular rate and rhythm. Normal S1, S2, without murmurs, rubs  or gallops.  LUNGS:  Clear to auscultation bilaterally.  ABDOMEN:  Positive bowel sounds x4, no bruits, soft, nontender, nondistended  without palpable mass or hepatosplenomegaly. No rebound, tenderness or  guarding.  RECTAL:  She does have hyperpigmentation to rectal area as well as loss of  architecture and areas of perfuse erythema. Internal exam was not performed.  EXTREMITIES:  She has left lower extremity lymphedema from previous surgery.   ASSESSMENT:  [name] is a 77 year old Caucasian female who presents  with intermittent hematochezia, fecal seepage, intermittent diarrhea  alternating with constipation for several months now. She has a history of  vulvar carcinoma, perineal radiation dermatitis. She is followed by  oncologist in [place]. She is also complaining of generalized abdominal  pain and epigastric pain as well. She is having nausea with this and has a  history of chronic reflux and Cameron ulcer.   Given the fact that [name]'s last colonoscopy was almost 6 years ago  and she is having intermittent hematochezia and fecal seepage, she is going  to need further evaluation to determine the source of her bleeding. This  could very well be related to her changes from radiation. She also has  chronic nausea and epigastric pain as well as chronic GERD and she is going  to require EGD to determine whether she has complicated GERD and/or the  development of peptic ulcer disease.   PLAN:  1.  Continue Prilosec 20 mg daily for now.  2.  Will schedule colonoscopy and EGD with Dr. Gerilyn Nestle in the near future.  3.  [name] notes today that she is continuing to have pain, itching      and a new lesion to her right labia. I have referred her back to be seen      by her regular oncologist regarding these symptoms.      [name], N.P.     Lionel December,  M.D.  Electronically Signed    KC/MEDQ  D:  03/26/2006  T:  03/26/2006  Job:  [id number]   cc:   Jonny Ruiz T. Kyla Balzarine, M.D.  [address]  [place]  [place] [address]   Kingsley Callander. Ouida Sills, MD  Fax: [phone]   Billie Lade, M.D.  Fax: [phone]

## 2011-05-10 NOTE — Consult Note (Signed)
NAME:  [name], [name]                      ACCOUNT NO.:  [id number]   MEDICAL RECORD NO.:  [id number]                   PATIENT TYPE:  OUT   LOCATION:  GYN                                  FACILITY:  [facility]   PHYSICIAN:  De Blanch, M.D.         DATE OF BIRTH:  July 13, 1927   DATE OF CONSULTATION:  04/04/2004  DATE OF DISCHARGE:                                   CONSULTATION   This 77 year old white female returns for continuing follow-up of vulvar  cancer.   INTERVAL HISTORY:  The patient is currently receiving radiation therapy to  the inguinal region and vulva for treatment of a positive inguinal node.  Because of desquamation of the vulvar and inguinal skin, radiation therapy  has been discontinued for a couple of weeks but she is back on radiation  therapy now.   Apparently, since my last visit with the patient, she developed a cellulitis  in her left lower extremity extending down to her knee.  It was treated with  antibiotics and apparently is improving steadily.  She has some diarrhea and  some urinary tract symptoms associated with the radiation therapy.   HISTORY OF PRESENT ILLNESS:  The patient underwent a modified radical  vulvectomy and left inguinal femoral lymphadenectomy in January of 2005.  She had free surgical margins but one of 13 positive inguinal lymph nodes  and was subsequently treated with radiation therapy.  She had a small wound  breakdown of the vulva.   PAST MEDICAL HISTORY:  1. Hypertension.  2. Obesity.   PAST SURGICAL HISTORY:  1. Ventral hernia repair.  2. Cholecystectomy.  3. Hiatal hernia repair.  4. Hysterectomy with bilateral salpingo-oophorectomy.  5. Radical vulvectomy.  6. Left inguinal lymphadenectomy.   CURRENT MEDICATIONS:  1. Atenolol.  2. Zoloft.  3. Xanax.  4. Celexa.  5. Altace.   ALLERGIES:  No known drug allergies.   FAMILY HISTORY:  Negative for gynecologic, breast or colon cancer.   The patient comes  accompanied by her daughter.  She does not smoke.  She is  widowed.   REVIEW OF SYMPTOMS:  Negative except as noted above.   PHYSICAL EXAMINATION:  GENERAL APPEARANCE:  The patient is a healthy white  female in no acute distress.  VITAL SIGNS:  Weight 171 pounds, blood pressure 152/90.  HEENT:  Negative.  NECK:  Neck is supple without thyromegaly.  ABDOMEN:  The abdomen is soft, nontender, no masses, organomegaly, ascites  or hernias noted.  A left inguinal incision is well healed.  There are no  palpable inguinal nodes.  EXTREMITIES:  The left lower extremity has a minimal amount of erythema but,  according to the patient and her daughter, this is much improved.  There is  no break in the skin in the inguinal region, although it has clearly been  radiated.  PELVIC:  EGBUS, status post left modified radical vulvectomy.  There are no  lesions noted.   IMPRESSION:  1. Stage III vulvar cancer, no evidence of recurrent disease.  2. Recent lymphangitis of the left lower extremity.   PLAN:  The patient will continue receiving her radiation therapy.  Will plan  on seeing her back as per the protocol. Her legs are measured by the  protocol nurse today.                                               De Blanch, M.D.    DC/MEDQ  D:  04/04/2004  T:  04/04/2004  Job:  [id number]   cc:   Billie Lade, M.D.  [address] [facility]  [place]  [place] [address]  Fax: [phone]   Telford Nab, R.N.  [phone] [address]  [place], [place] [address]   S. Kyra Manges, M.D.  [phone] [address]  [place]  [place] [address]  Fax: [phone]   Kingsley Callander. Ouida Sills, M.D.  [address]  [place]  [place] [address]  Fax: [phone]

## 2011-05-10 NOTE — H&P (Signed)
NAME:  [name], [name]                      ACCOUNT NO.:  [id number]   MEDICAL RECORD NO.:  [id number]                   PATIENT TYPE:  INP   LOCATION:  A316                                 FACILITY:  [facility]   PHYSICIAN:  Angus G. Renard Matter, M.D.              DATE OF BIRTH:  [birth year]   DATE OF ADMISSION:  03/15/2004  DATE OF DISCHARGE:                                HISTORY & PHYSICAL   CHIEF COMPLAINT:  This 77 year old white female was brought to the emergency  room with the chief complaint being pain in the left knee and left leg.   HISTORY:  This patient apparently has a history of vulvar cancer and has  received irradiation treatments for this.  For 2-3 days she had noted pain  and swelling in the left leg.  She was seen by ED physician in the emergency  room and felt to have cellulitis of the left leg.   LAB DATA:  CBC:  WBC 10,000, hemoglobin 10.2, hematocrit 31.2, 85%  neutrophils, 6% lymphocytes.  The patient was given IV Ancef 1 gm in the ED.  IV fluid was started and she was subsequently admitted for further  treatment.   SOCIAL HISTORY:  The patient does not smoke or drink.   FAMILY HISTORY:  See previous record.   PAST MEDICAL AND SURGICAL HISTORY:  The patient has a history of vulvar  cancer treated with surgery and irradiation.   ALLERGIES:  She has allergy to CODEINE.   MEDICATION LIST:  Altace, Xanax, atenolol and Zocor.   REVIEW OF SYSTEMS:  HEENT:  Negative.  CARDIOPULMONARY:  No cough,  hemoptysis or dyspnea.  GI:  No bowel irregularity or bleeding.  GU:  No  dysuria or hematuria.   PHYSICAL EXAMINATION:  VITAL SIGNS:  An alert female with blood pressure  93/53, pulse 58, respirations 20, temperature 98.5.  HEENT:  Eyes PERRLA.  TMs negative.  Oropharynx:  Benign.  NECK:  Supple.  No JVD or thyroid abnormalities.  LUNGS:  Clear to P&A.  HEART:  Regular rhythm.  ABDOMEN:  No palpable organs or masses.  EXTREMITIES:  The patient has extremely tender  left leg with patchy areas of  redness of skin thigh and leg, increased temperature.   DIAGNOSES:  1. Cellulitis of left leg.  2. History of vulvar cancer.     ___________________________________________                                         Ishmael Holter. Renard Matter, M.D.   AGM/MEDQ  D:  03/15/2004  T:  03/16/2004  Job:  [id number]

## 2011-05-10 NOTE — Discharge Summary (Signed)
[name], CASERTA NO.:  [id number]   MEDICAL RECORD NO.:  [id number]           PATIENT TYPE:   LOCATION:                                 FACILITY:   PHYSICIAN:  Nelda Bucks, MD DATE OF BIRTH:  04/14/1927   DATE OF ADMISSION:  DATE OF DISCHARGE:                               DISCHARGE SUMMARY   DEATH SUMMARY.   DATE OF DEATH:   HISTORY:  This 77 year old white female with past medical history of  vulval carcinoma, we are unclear if it was recent chemotherapy or  radiation.  The patient was residing in nursing home and was found to be  febrile, hypotensive.  EMGs were called to the patient and transferred  to [facility] for further management.  C. diff colitis was  positive and was found to have a severely elevated white count of 90,000  range.  Abdominal CT scan shows severely impaired colitis and was  started on antibiotics for mainly septic shock and transferred to [facility] for further specialized management.  The patient also  noted to have a renal failure.  The patient had rising pressor  requirements.  She had failed dobutamine use on February 29, 2008.  Echocardiogram was performed, which showed possible hypovolemia,  aggressive hypovolume association given there was a high specific C.  diff colitis to this patient, which we treated basically and  inappropriately with oral vancomycin, IV Flagyl.  ID was consulted to  make a consideration for IVIG, but appeared that this patient with  multiorgan failure secondary to septic shock and secondary C. diff  colitis at very poor prognosis.  IVIG was decided and may be helpful and  then was ordered as well as PR vancomycin by the infectious disease  specialist.  The patient worsened with her pressor requirements.  Discussions were had with her son that she did not wanted to continue  aggressive followup measures for her and she worsened with her pressors,  she was on maximum dose of  Levophed and vasopressin and it was decided  to try comfort care for this patient clearly from her prior, which she  described to her son that she would not want to be put through this as  she was having multiorgan failure with no improvements, and the patient  was provided comfort and expired.   DEATH DIAGNOSES:  1. Severe Clostridium difficile colitis.  2. Septic shock.  3. Ventilator-dependent respiratory failure.      Nelda Bucks, MD  Electronically Signed     DJF/MEDQ  D:  04/14/2008  T:  04/14/2008  Job:  [id number]

## 2011-05-10 NOTE — Op Note (Signed)
NAMEELANORE, TALCOTT            ACCOUNT NO.:  [id number]   MEDICAL RECORD NO.:  [id number]          PATIENT TYPE:  AMB   LOCATION:  DAY                          FACILITY:  [facility]   PHYSICIAN:  Paola A. Duard Brady, MD    DATE OF BIRTH:  11-02-1927   DATE OF PROCEDURE:  02/25/2007  DATE OF DISCHARGE:                               OPERATIVE REPORT   PREOPERATIVE DIAGNOSIS:  Recurrent vulvar carcinoma, enlarged right  groin node.   POSTOPERATIVE DIAGNOSIS:  Recurrent vulvar carcinoma, enlarged right  groin node.   PROCEDURE:  Right groin node excision.   SURGEON:  Paola A. Duard Brady, MD   ASSISTANT:  Telford Nab, R.N.   ANESTHESIA:  Monitored anesthesia care and local with 11 mL of 0.25%  Marcaine with epi.   SPECIMENS:  Right groin node to pathology.   ESTIMATED BLOOD LOSS:  Minimal.   IV FLUIDS:  800 mL.   COMPLICATIONS:  None.   OPERATIVE FINDINGS:  Included a 3 x 3 cm right groin node that was  grossly metastatic.  It was removed without difficulty.  At the  completion of the procedure, hemoclips were placed at the base and four  quadrants 12, 3, 6 and 9 of the groin node bed to assist with radiation  therapy.   The patient was taken to the operating room, placed in supine position  where IV sedation was given.  The patient was then placed in dorsal  lithotomy position, awake to her comfort level.  The groin was then  prepped in the usual sterile fashion and draped out.  At this point time-  out was performed to confirm the patient, the procedure and her  allergies.  10 mL of 0.25% Marcaine with epinephrine was then injected  subcu and into the deeper subcutaneous tissues.  The patient tolerated  this procedure well.  A transverse groin incision was made in the area  overlying the groin node.  The incision measured approximately 3.5 cm in  length.  It was carried down to the first layer of fascia with a knife.  Camper's fascia was then entered sharply.  The node was  identified and  grasped with an Allis clamp.  The groin node was gradually elevated out  of the subcu groin node basin.  The adventitial tissue attaching the  groin node was taken down with Bovie cautery.  The groin node was then  elevated to the level of the skin and was grasped by the surgeon's  fingers.  Again the adhesive tissues to the groin node were taken down  using Bovie cautery.  Palpation within the groin node basin revealed no  additional enlarged adenopathy.  Only a superficial groin node removal  was performed.  Pinpoint hemostasis obtained using Bovie cautery.  Hemoclips were placed at the depths of the groin bed as well as at 12,  3, 6 and 9 o'clock to assist with future radiation oncology planing.  The groin node basin was then irrigated with water and noted to be  hemostatic.  Fascia was then closed with a running 2-0 Vicryl.  The  subcu tissues were  made hemostatic using pinpoint cautery.  The skin was  noted to be hemostatic.  An additional 1 mL of 0.25% Marcaine was then  injected superior and lateral to the incision.  A 0.5 cm stab mark was  made with a knife and using a Burlisher, a round 15-blade was then  placed just under the fascia.  This drain was secured with nylon.  After  the drain was in the appropriate position under the fascia, the subcu  tissues were again irrigated noted be hemostatic.  Skin was closed using  a running 4-0 Vicryl.  Steri-Strips and sterile dressing were applied.   The patient tolerated procedure well, was taken to the recovery room in  stable condition.  All needle, instrument and Ray-Tec counts were  correct x2.      Paola A. Duard Brady, MD  Electronically Signed     PAG/MEDQ  D:  02/25/2007  T:  02/25/2007  Job:  [id number]   cc:   Telford Nab, R.N.  [address]  [place], [place] [address]   Billie Lade, M.D.  Fax: [phone]   Kingsley Callander. Ouida Sills, MD  Fax: [phone]   Nicholas Lose, N.P.   Quita Skye. Artis Flock, M.D.  Fax:  [phone]   S. Kyra Manges, M.D.  Fax: [phone]

## 2011-05-10 NOTE — Discharge Summary (Signed)
NAME:  [name], [name]                      ACCOUNT NO.:  [id number]   MEDICAL RECORD NO.:  [id number]                   PATIENT TYPE:  INP   LOCATION:  A316                                 FACILITY:  [facility]   PHYSICIAN:  Kingsley Callander. Ouida Sills, M.D.                  DATE OF BIRTH:  [birth year]   DATE OF ADMISSION:  03/15/2004  DATE OF DISCHARGE:  03/20/2004                                 DISCHARGE SUMMARY   DISCHARGE DIAGNOSES:  1. Left leg cellulitis.  2. Vulvar carcinoma.  3. Coronary artery disease.  4. Depression/anxiety.  5. Hyperlipidemia.   HOSPITAL COURSE:  This patient is a 77 year old white female with vulvar  carcinoma presently undergoing radiation therapy and chemotherapy who  developed marked redness, swelling, warmth and tenderness in the left knee  and thigh area.  She presented to the emergency room and was diagnosed with  an acute cellulitis.  Her white count was 10,000.  She was febrile to 101.8.  She was admitted and started on IV Ancef.  She received DVT prophylaxis with  Lovenox.  She was initially hypokalemic at 3.2.  She normalized with  supplementation to 3.7.  She had an elevated AST of 48 which normalized to  27.  She had a microcytic anemia initially with a hemoglobin of 10.2 with a  repeat of 9.2.  She has had previous thorough GI evaluation.   Blood cultures were negative.  Her fever resolved.  Her redness, swelling  and warmth improved to the point that she was stable for conversion to oral  antibiotics and discharged home on March 29.  She will follow up in the  office in one week.   DISCHARGE MEDICATIONS:  1. Cephalexin 500 mg q.6h. for 10 days.  2. Altace 5 mg daily.  3. Zocor 40 mg q.h.s.  4. Atenolol 100 mg daily.  5. Xanax 1 mg q.i.d. p.r.n.  6. Celexa 60 mg daily.     ___________________________________________                                         Kingsley Callander. Ouida Sills, M.D.   ROF/MEDQ  D:  03/20/2004  T:  03/20/2004  Job:  [id number]

## 2011-05-10 NOTE — Consult Note (Signed)
[name], [name] NO.:  [id number]   MEDICAL RECORD NO.:  [id number]          PATIENT TYPE:  OUT   LOCATION:  GYN                          FACILITY:  [facility]   PHYSICIAN:  De Blanch, M.D.DATE OF BIRTH:  May 27, 1927   DATE OF CONSULTATION:  07/29/2006  DATE OF DISCHARGE:  07/22/2006                                   CONSULTATION   GYN ONCOLOGY CLINIC:   CHIEF COMPLAINT:  Vulvar cancer, wound infection.   The patient returns today for further follow up.  She underwent wide local  excision of recurrent vulvar cancer on July 08, 2006.  She had wound  breakdown, and is currently managing this conservatively with irrigation and  sitz baths, air drying, and Silvadene cream.  She reports she is doing much  better than she was at her last visit with [place], a week ago.  She denies any  fever or chills.  Urinary function is good.  She does have occasional  constipation, and does have some pain in the area of the vulva.   PHYSICAL EXAMINATION:  Weight 173 pounds.  On pelvic exam EGBUS.  The vulvar  wounds are open, but granulating.  There is no evidence of new lesions.  The  area is not bleeding and there is no evidence of erythema.  She does have  changes on the rest of the vulva associated with radiation therapy, as well  as some lichen sclerosis.   She will continue her current regimen and will return to see [place] in four  weeks for continuing follow up.      De Blanch, M.D.  Electronically Signed     DC/MEDQ  D:  07/29/2006  T:  07/29/2006  Job:  [id number]   cc:   Telford Nab, R.N.  [address]  [place], [place] [address]

## 2011-05-10 NOTE — Consult Note (Signed)
[name], [name]            ACCOUNT NO.:  [id number]   MEDICAL RECORD NO.:  [id number]          PATIENT TYPE:  OUT   LOCATION:  GYN                          FACILITY:  [facility]   PHYSICIAN:  Paola A. 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 At that time, she underwent a  modified left radical vulvectomy, left inguinal femoral lymphadenectomy. She  had 1 node out of 13 lymph nodes positive and received whole pelvic and left  inguinal radiation therapy. Subsequently, she had some problems with  radiation vulvitis. She has had vulvar biopsies in the recent past that were  felt like sclerosis. Since she was last seen, she was seen by Dr. De Blanch in October of 2006. At that time, there was a new lesion on  her right vulva which was quite painful. It was felt to be most consistent  with recurrent disease. She subsequently underwent a wide local excision of  right modified radial vulvectomy and a wide local excision of the left  posterior vulvar lesion. Findings included on the right vulva two exophytic  lesions measuring a 1.5 cm in diameter and there was a 1.5 cm lesion on the  left vulva which was raised and thickened. Final pathology on the right  vulva was consistent with an invasive moderately differentiated squamous  cell carcinoma measuring 1.2 cm in lateral extent and 0.4 cm in depth of  invasion. There was a separate nodule of in situ squamous cell carcinoma.  There was lymphovascular involvement. The left posterior vulva revealed  invasive moderately differentiated squamous cell carcinoma. It measured 0.7  cm in lateral extent and 0.1 cm in depth of invasion. The margins were  negative. The plan had been to proceed with right superficial groin  dissection  after she recuperated from this. However, the patient's family  called Telford Nab yesterday and asked that the patient come in today to  be seen for evaluation as she was not doing well. The patient states that  she was doing okay until over the weekend when she began having significant  dyschiasia and overall was not able to walk secondary to severe pain. She  had been doing sitz baths but fairly unreliably. She cannot really comment  on when she started having any discharge bleeding. She cannot comment on  whether there are fevers or chills.   PHYSICAL EXAMINATION:  VITAL SIGNS:  Blood pressure 140/70. Weight 166  pounds, which is down from 178 pounds a month ago.  GENERAL:  An ill-appearing female in no acute distress.  PELVIC:  External genitalia is notable for surgically absent left vulva.  Inferiorly in the left gluteal area there is an approximately 5 x 3 cm  defect. There is no surrounding erythema. There is a thick yellow exudate  with the appearance of some granulation tissue arising from the base. There  is no  fluctuant or necrotic tissue. The right vulva is notable for complete  separation of the surgical incision. There is a thick, yellow exudate with a  foul odor. There is no fluctuant. The lesion is quite tender to palpation.  There is a slight dusky nature to the inferior portion. There is no crepitus  noted.   ASSESSMENT:  A 77 year old status post wide local excision for a new versus  recurrent vulvar carcinoma who has had separation of her wound and it has  super infected.   PLAN:  I have discussed with the patient and her son and I feel that she  needs to be admitted to [facility]. I have called and arranged for a  direct admission for her for whirlpool debridement and consideration of IV  antibiotics. The wound will also need to be followed carefully to be sure  that no necrosis occurs. This has been communicated with Dr. Gloris Manchester who  will assist in her  admission process at [facility]. We will subsequently  cancel the patient's appointment with Dr. De Blanch this coming  Friday.      Paola A. Duard Brady, MD  Electronically Signed     PAG/MEDQ  D:  11/19/2005  T:  11/20/2005  Job:  [id number]   cc:   Telford Nab, R.N.  [address]  [place], [place] [address]   Kingsley Callander. Ouida Sills, MD  Fax: [phone]   S. Kyra Manges, M.D.  Fax: [phone]   Quita Skye. Artis Flock, M.D.  Fax: [phone]   Billie Lade, M.D.  Fax: [phone]   Roseanna Rainbow, M.D.  Fax: [phone]

## 2011-05-10 NOTE — Op Note (Signed)
[name], [name]            ACCOUNT NO.:  [id number]   MEDICAL RECORD NO.:  [id number]          PATIENT TYPE:  AMB   LOCATION:  DAY                          FACILITY:  [facility]   PHYSICIAN:  De Blanch, M.D.DATE OF BIRTH:  01-Jun-1927   DATE OF PROCEDURE:  07/08/2006  DATE OF DISCHARGE:                                 OPERATIVE REPORT   PREOPERATIVE DIAGNOSIS:  Vulvar lesion, probable recurrent vulvar cancer.   POSTOPERATIVE DIAGNOSIS:  Vulvar lesion, probable recurrent vulvar cancer.   PROCEDURE:  Partial vulvectomy.   SURGEON:  De Blanch, M.D.   FIRST ASSISTANT:  Telford Nab, R.N.   ANESTHESIA:  General with LMA.   ESTIMATED BLOOD LOSS:  50 mL.   SURGICAL FINDINGS:  Examination of the vulva revealed changes consistent  with prior radical vulvectomy as well as radiation therapy.  In the left  labial crural fold, there were a number bolus lesions which had previously  been identified as bullous lichen sclerosis.  On the right vulva was a  lesion approximately 3-4 cm which was exophytic.  This was located t  approximately 7 o'clock position.  There was a separate lesion in the  perineal body measuring approximately 1 cm and another 1-cm lesion which was  more exophytic just superior to the larger lesion on the right vulva.   PROCEDURE:  The patient brought to the operating room, and after  satisfactory attainment of general anesthesia, he was placed in low  lithotomy position in candy-cane stirrups.  The perineum and vagina were  prepped with Betadine, and the patient was draped.  Straight catheter was  used to empty the bladder.  The lesions were identified and outlined using a  marking pen.  A skin incision was then created, and the deep subcutaneous  tissue was incised using cautery.  Vascular pedicles and the bulbocavernosus  muscle were clamped and suture ligated to achieve hemostasis.  Once the  lesion was excised, hemostasis achieved with  cautery.  The perineum was  closed first transversely using interrupted mattress sutures of 2-0 Vicryl.  The skin edges were reapproximated with 3-0 Vicryl with interrupted sutures.  The incision ultimately had extended to the area of the clitoris.  This area  was reapproximated in a vertical fashion extending down to approximately 7  o'clock.  Then, the incision became a lateralized incision in the proximal  location of a medial lateral episiotomy.  All skin edges were reapproximated  with interrupted vertical mattress sutures of 2-0 Vicryl and some  reapproximation with interrupted sutures of 3-0 Vicryl.  Care was taken to  avoid developing a hood over the urethra.   The patient was awakened from anesthesia and taken to the recovery room  after her vulva was washed carefully with saline.  Sponge, needle and  instrument counts were correct x2.      De Blanch, M.D.  Electronically Signed     DC/MEDQ  D:  07/08/2006  T:  07/08/2006  Job:  [id number]   cc:   Billie Lade, M.D.  Fax: [phone]   Telford Nab, R.N.  [address]  [place],  [place] [address]   Venancio Poisson

## 2011-05-10 NOTE — Consult Note (Signed)
[name], EMRICH NO.:  [id number]   MEDICAL RECORD NO.:  [id number]          PATIENT TYPE:  OUT   LOCATION:  GYN                          FACILITY:  [facility]   PHYSICIAN:  De Blanch, M.D.DATE OF BIRTH:  [birth year]   DATE OF CONSULTATION:  02/20/2007  DATE OF DISCHARGE:                                 CONSULTATION   CHIEF COMPLAINT:  Vulvar cancer.   INTERVAL HISTORY:  Since her last visit, the patient has done well.  Her  vulvar surgery is completely recovered, and she is having no difficulty  except for occasional diarrhea.   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  At that time,  she underwent a modified left radical vulvectomy and left inguinal  femoral lymphadenectomy.  She had 1/13 nodes that were positive and  therefore received whole pelvis and left inguinal radiation which  included the vulva.  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  On followup exam, she was found to have recurrence of the  vulva in November 2006, and this was located in the right vulva with 2  exophytic lesions, 1 of which was invasive moderately differentiated  measuring 0.1 cm and with 1 mm of invasion; margins were negative.  The  patient had vulvar wound breakdown and had to be readmitted for  debridement whirlpool therapy.  She had complete wound healing and yet  had another recurrence in the left posterior vulva May 2007.  She  underwent a right modified radical vulvectomy with excision of a 3-4 cm  exophytic lesion.  Final pathology showed depth of invasion of 0.4 cm  with a lesion measuring 2.7 cm in diameter.  There was evidence of lymph  vascular and perineural invasion, but surgical margins were negative.  I  think the patient again has superficial wound breakdown.  Because of her  age and general disability, an inguinal lymphadenectomy was not  performed at the original surgery.   PAST MEDICAL HISTORY:   MEDICAL ILLNESSES:  Hypertension and diabetes.   DRUG ALLERGIES:  None.   CURRENT MEDICATIONS:  Zoloft, Xanax, Celexa, Altace, Prilosec and  atenolol.   FAMILY HISTORY:  Negative for gynecologic breast or colon cancer.   SOCIAL HISTORY:  The patient is widowed, comes accompanied by her son  today.  She does not smoke.   REVIEW OF SYSTEMS:  A 10-point comprehensive review of systems negative  except as noted above.   PHYSICAL EXAM:  VITAL SIGNS:  Weight 170 pounds, blood pressure 144/77.  GENERAL:  The patient is a healthy pleasant elderly white female in no  acute distress.  HEENT:  Negative.  NECK:  Supple without thyromegaly.  There is no supraclavicular  adenopathy.  However, on palpation of the right inguinal region, there  was approximately a 3 cm lymph node, easy identified as mobile, but is  clearly abnormal and was not present at our last exam.  The left  inguinal region appears to be normal.  PELVIC EXAM:  EG/BUS shows changes of prior surgeries plus changes  from  radiation therapy.  No lesions are noted.  The rectovaginal septum and  perineal body are very thin from prior resection, and she has some small  amount of protruding anal mucosa.  The prior surgical site, however, is  completely healed.   IMPRESSION:  Recurrent vulvar carcinoma now with a very suspicious right  inguinal lymph node.   PLAN:  I had a lengthy discussion with the patient and her son and  recommend that we undertake a inguinal femoral lymphadenectomy in the  right inguinal region to resect the lymph node and adjacent nodes.  This  surgery is scheduled for March 5.  Dr. Cleda Mccreedy will be performing  the procedure.  The patient is informed she will have a drain left in  place which may need to remain for several weeks.  She is familiar with  this having had a drain in her at the time of her regional operation.  All questions were  answered, and surgery is scheduled.      De Blanch, M.D.  Electronically Signed     DC/MEDQ  D:  02/20/2007  T:  02/20/2007  Job:  [id number]   cc:   Telford Nab, R.N.  [address]  [place], [place] [address]   Kingsley Callander. Ouida Sills, MD  Fax: [phone]   Billie Lade, M.D.  Fax: [phone]

## 2011-05-10 NOTE — Op Note (Signed)
[name], [name]            ACCOUNT NO.:  [id number]   MEDICAL RECORD NO.:  [id number]          PATIENT TYPE:  AMB   LOCATION:  DAY                           FACILITY:  [facility]   PHYSICIAN:  Lionel December, M.D.    DATE OF BIRTH:  Dec 15, 1927   DATE OF PROCEDURE:  DATE OF DISCHARGE:                                 OPERATIVE REPORT   PROCEDURE:  Esophagogastroduodenoscopy followed by colonoscopy.   INDICATIONS:  [name] is 77 year old Caucasian female with chronic GERD, who  is on PPI, keep experiencing upper abdominal pain.  Some of these episodes  are postprandially.  She also has regular bowel movements with bowel urgency  and few episodes of fecal seepage.  She is felt to have IBS.  She is also  complaining of intermittent hematochezia.  She has history of colonic  polyps, and therefore, undergoing diagnostic/surveillance colonoscopy along  with EGD.  She is undergoing diagnostic EGD and colonoscopy.  Procedure  risks were reviewed with the patient and informed consent was obtained.  Meds for conscious sedation, benzocaine spray for oropharyngeal topical  anesthesia, Demerol 50 mg IV, Versed 12 mg IV.   FINDINGS:  Procedure performed in endoscopy suite.  The patient's vital  signs and O2 sat were monitored during procedure and remained stable.   PROCEDURE #1 ESOPHAGOGASTRODUODENOSCOPY:  The patient was placed in left  lateral recumbent position and Olympus videoscope was passed via oropharynx  without any difficulty into esophagus.   ESOPHAGUS.  Mucosa of the esophagus normal.  GE junction was at 30 cm from  the incisors.  There was edema and erythema at GE junction, but no ring or  stricture was noted.  Hiatus was at 35 cm and was wide open with a dependent  segment of hernia on the left side.  Mucosa of the herniated in the part of  the stomach was normal, though.   STOMACH.  It was empty and distended very well with insufflation.  Folds of  proximal stomach were normal.   Examination of mucosa at body, antrum,  pyloric channel as well as angularis, fundus and cardia was normal.  Examination of mucosa revealed few antral erosions.  Pyloric channel was  patent.  Angularis, fundus and cardia were examined by retroflexing the  scope and were normal.  Hernia was easily seen on this view.   Duodenum bulbar mucosa was normal.  Scope was passed in second part of the  duodenum where mucosa and folds were normal.  Endoscope was withdrawn.  The  patient prepared for procedure #2.   COLONOSCOPY:  Rectal examination performed.  No abnormality noted on  external or digital exam.  Olympus videoscope was placed in the rectum and  advanced, under vision, into sigmoid colon beyond.  Very redundant sigmoid  colon with few scattered diverticula.  Slowly and carefully scope was passed  into splenic flexure and further intubation of cecum was easy.  Cecum was  identified by ileocecal valve and appendiceal stump.  Pictures taken for the  record.  As the scope was withdrawn, colonic mucosa was examined for the  second time and there were  no polyps or mucosal changes to suggest colitis.  The rectal mucosa similarly was normal.  Scope was retroflexed to examine  anorectal junction and small hemorrhoids were noted below the dentate line.  Endoscope was then withdrawn.  The patient tolerated the procedures well.   FINAL DIAGNOSIS:  1.  Mild changes of reflux esophagitis limited to gastroesophageal junction.      Esophageal erosions have healed since previous examination of 2003.  2.  Moderate sized sliding hiatal hernia.  3.  Erosive antral gastritis.  Rule out H. pylori infection.  4.  Scattered diverticula at sigmoid and descending colon.  5.  Small external hemorrhoids.  6.  No evidence of recurrent polyps.   Suspect her lower GI symptoms are secondary to irritable bowel syndrome.   RECOMMENDATIONS:  1.  Antireflux measures reinforced.  2.  The patient has take Prilosec 20  mg p.o. q.a.m.Marland Kitchen  3.  High-fiber diet.  4.  Dicyclomine 10 mg before breakfast and lunch.  5.  Fiber Choice chew 2 tablets q.d.   She will return OV in 4-6 weeks.      Lionel December, M.D.  Electronically Signed     NR/MEDQ  D:  03/31/2006  T:  04/01/2006  Job:  [id number]   cc:   Kingsley Callander. Ouida Sills, MD  Fax: [phone]   Dr. Elly Modena, [place]

## 2011-05-10 NOTE — Consult Note (Signed)
NAME:  [name], [name]                       ACCOUNT NO.:  [id number]   MEDICAL RECORD NO.:  [id number]                   PATIENT TYPE:  OUT   LOCATION:  GYN                                  FACILITY:  [facility]   PHYSICIAN:  De Blanch, M.D.         DATE OF BIRTH:  06/24/1927   DATE OF CONSULTATION:  12/13/2003  DATE OF DISCHARGE:                                   CONSULTATION   REASON FOR CONSULTATION:  A 77 year old white female seen in consultation at  the request of Dr. Kyra Manges.   The patient tells me she has a 10 year history of vulvar pruritis.  Recently, a lesion was identified on the vulva which became quite painful,  especially when she is urinating.  She was seen by Dr. Elana Alm on November 22, 2003, where upon a vulvar biopsy was obtained of an obvious lesion which  revealed a squamous cell carcinoma.   The patient has no other significant gynecologic history.  She has undergone  a hysterectomy and bilateral salpingo-oophorectomy for uterine fibroids in  the past.   PAST MEDICAL HISTORY:  Hypertension.   PAST SURGICAL HISTORY:  1. Ventral hernia repair.  2. Cholecystectomy.  3. Hiatal hernia repair.  4. Hysterectomy with bilateral salpingo-oophorectomy.   CURRENT MEDICATIONS:  1. Atenolol.  2. Zoloft.  3. Xanax.  4. Celexa.  5. Altace.   ALLERGIES:  No known drug allergies.   FAMILY HISTORY:  Negative for gynecologic, breast, or colon cancer.  She had  an aunt with some sort of cancer, but it is unknown.   SOCIAL HISTORY:  The patient is widowed.  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  She comes accompanied by her daughter today.   REVIEW OF SYSTEMS:  Negative except for dysuria when urine comes in contact  with her vulvar lesion.  She denies any other GI or GU symptoms.   PHYSICAL EXAMINATION:  VITAL SIGNS:  Height 5 feet 6 inches, weight 174  pounds, blood pressure 140/86, pulse 66, respiratory rate 18.  GENERAL:  The patient is a healthy  white female appearing her stated age.  HEENT:  Negative.  NECK:  Supple without thyromegaly.  There is no supraclavicular or inguinal  adenopathy.  ABDOMEN:  Soft, nontender.  No mass, organomegaly, ascites, or hernias are  noted.  EXTREMITIES:  Lower extremities are without edema or varicosities.  PELVIC:  EGBUS shows an ulcerative lesion occupying nearly the entire left  vulva measuring approximately 6 x 4 cm.  This comes within 1 cm of her  urethral meatus and nearly to the midline of the perineum.  It does not  involve the anus or vagina.  She also has changes consistent with liken  sclerosis on the right vulva, no frank lesions are noted.   The patient's vagina is so stenotic that I am really unable to do an  adequate speculum examination.  The lesion on the left vulva is not  fixed,  and it is freely mobile.   The patient's records from Dr. Lavon Paganini office are reviewed.   IMPRESSION:  Stage II (clinical) squamous cell carcinoma of the vulva.   I had a lengthy discussion with the patient and her daughter regarding  management.  I have recommend she undergo a left modified radical vulvectomy  and left inguinal femoral lymphadenectomy.  The prognostic features  including metastatic disease to the inguinal region were discussed.  We also  discussed potential complications of surgery, including wound breakdown,  infection, change in urinary stream, lymphedema, lymphocyst, and pain, as  well as deep vein thrombosis and pulmonary embolism.  The patient wishes to  go ahead with surgery as soon as possible because of her pain.  We have  contacted Dr. Lavon Paganini office, and in order to expedite scheduling, we will  go ahead and schedule the patient to undergo surgery on December 27, 2003.  We  would anticipate her spending one night in the hospital.   The patient is also presented with the idea of participating in GOG protocol  using Tissel for wound closure, and she will see the protocol  nurse later  today.                                               De Blanch, M.D.    DC/MEDQ  D:  12/13/2003  T:  12/13/2003  Job:  [id number]   cc:   S. Kyra Manges, M.D.  [id number]  [place]  [place] [address]  Fax: [phone]   Telford Nab, R.N.  [address]  [place], [place] [address]

## 2011-05-10 NOTE — Consult Note (Signed)
NAMEHINLEY, BRIMAGE            ACCOUNT NO.:  [id number]   MEDICAL RECORD NO.:  [id number]          PATIENT TYPE:  OUT   LOCATION:  GYN                          FACILITY:  [facility]   PHYSICIAN:  Paola A. Duard Brady, MD    DATE OF BIRTH:  [birth year]   DATE OF CONSULTATION:  07/22/2006  DATE OF DISCHARGE:                                   CONSULTATION   Ms. Narang is a patient who is very well known to our service.  She has a  long history of vulvar carcinoma and has been treated both with radiation  and multiple interval surgical excisions.  She was initially diagnosed with  stage III disease in January 2005.  She had positive lymph nodes and  received whole pelvic and left inguinal groin radiation.  She has had  changes consistent with chronic radiation vulvitis as well as bullous lichen  sclerosus et atrophicus which is under the care of Dr. Nicholas Lose and is doing  well with fluorinated steroids.  Most recently, she underwent a wide local  excision for biopsy-proven invasive carcinoma.  Her surgery was on July 17  and revealed multifocal invasive squamous cell carcinoma with lymphovascular  and perineural invasion, negative margins.  She appears to have had a  superficial separation, was seen by Dr. Kyla Balzarine on July 24, and comes in today  for followup of the same.  At the time she was seen by him she had  approximately separation of about 70% of vulvectomy site including changes  extending across the rectovaginal septum with gray necrotic tissue which had  debrided at that time.  She comes in today for followup.  She states she is  doing her sitz baths, rinsing, blotting and blow-drying, as well using  Silvadene cream.  In addition, she is using her steroids.  Overall she  states she is not sure if she is feeling better or worse than she did  before.  She has a couple of areas that are particularly sensitive.   PHYSICAL EXAMINATION:  External genitalia reveals separation of the entire  suture line.  Sutures were removed sharply.  There is yellow exudative  material on the right side greater than the left.  The left side has some  nice pink granulation tissue along with the bullous lichen sclerosus et  atrophicus which is markedly improved from when I saw her last month.  On  the right vulva there is a large crater.  There is no necrotic tissue but  there is significant exudative tissue.  This was debrided to the patient's  tolerance and again, sutures removed sharply.   ASSESSMENT:  Superficial wound separation status post vulvectomy for  persistent vulvar carcinoma.   PLAN:  1.  She was instructed again on how to do her sitz baths.  We discussed with      her son getting her shower head that      she can use to really increase the irrigative potential on that right      side.  She will continue using her fluorinated steroids in that area of  topical bullous edema.  2.  She will return to see [place] in 1 week.  Harriett Sine will contact her with that      appointment.      Paola A. Duard Brady, MD  Electronically Signed     PAG/MEDQ  D:  07/22/2006  T:  07/22/2006  Job:  [id number]   cc:   Telford Nab, R.N.  [address]  [place], [place] [address]   Kingsley Callander. Ouida Sills, MD  Fax: [phone]   Billie Lade, M.D.  Fax: [phone]   Nicholas Lose, N.P.   Roseanna Rainbow, M.D.  Fax: [phone]   Quita Skye. Artis Flock, M.D.  Fax: [phone]   S. Kyra Manges, M.D.  Fax: [phone]

## 2011-05-10 NOTE — Consult Note (Signed)
NAMEANAI, LIPSON            ACCOUNT NO.:  [id number]   MEDICAL RECORD NO.:  [id number]          PATIENT TYPE:  OUT   LOCATION:  GYN                          FACILITY:  [facility]   PHYSICIAN:  John T. Kyla Balzarine, M.D.    DATE OF BIRTH:  1927-05-26   DATE OF CONSULTATION:  07/15/2006  DATE OF DISCHARGE:                                   CONSULTATION   CHIEF COMPLAINT:  Suspected vulvar infection post partial vulvectomy.   HISTORY:  The patient underwent posterior partial vulvectomy on July 17 for  multifocal invasive squamous cell carcinoma.  Surgical margins were negative  and lymph vascular/perineural invasion was identified.  Lesions invaded 0.4  and 0.2 cm in depth with the largest focus 2.7 cm in diameter.  It should be  noted that the patient has longstanding lichen sclerosis including bullous  lichen sclerosis treated intermittently with topical steroids.  She comes in  for a wound check as she is concerned that she may have a wound infection.  She has a watery discharge and marked pain in the left posterior labia  majora.   PHYSICAL EXAMINATION:  Afebrile.  Blood pressure 140/70.  Inspection the  vulva reveals suture line separation over approximately 70% of the  vulvectomy site, including extending across the rectovaginal septum.  There  is gray necrotic tissue which was debrided along with suture removal.  There  is no evidence of superficial cellulitis.  Diffuse changes of lichen  sclerosis are present, especially prominent in the dependent labia majus on  the left with bulla formation and LSA changes extending into the left labial  crural fold.  Again there is no evidence of cellulitis.   ASSESSMENT:  Superficial wound separation with minimal changes of infection.  Bullous lichen sclerosis.   PLAN:  The patient was instructed to use a rinse/blot/blow dry regimen  supplemented with Silvadene.  She has a prescription for fluorinated  steroids which she can use topically in  the area of bullous lichen sclerosis  2-3 times daily.  The patient will return for a wound check next week.      John T. Kyla Balzarine, M.D.  Electronically Signed     JTS/MEDQ  D:  07/15/2006  T:  07/16/2006  Job:  [id number]

## 2011-05-10 NOTE — Consult Note (Signed)
NAMEHAIDEE, [name] NO.:  [id number]   MEDICAL RECORD NO.:  [id number]          PATIENT TYPE:  OUT   LOCATION:  GYN                          FACILITY:  [facility]   PHYSICIAN:  De Blanch, M.D.DATE OF BIRTH:  January 11, 1927   DATE OF CONSULTATION:  09/05/2006  DATE OF DISCHARGE:  09/05/2006                                   CONSULTATION   CHIEF COMPLAINT:  Vulvar cancer, wound breakdown.   The patient returns today for continuing follow up having been seen last  approximately four weeks ago at which time her vulvar wound was granulating.  She has continued to irrigate the wound and reports that her pain is gone.  She has not had any bleeding or discharge.  She does have some incontinence  of stool.   PHYSICAL EXAMINATION:  Weight 175 pounds.  Blood pressure 130/78.  PELVIC EXAM:  EG/BUS shows that the vulvar wound has completely  epithelialized and sealed.  She has a minimal perineal body.  There is some  minimal amount of stool on the external anus.  The remaining of the vulva  shows stigmata of radiation therapy and prior surgeries but no new lesions.   IMPRESSION:  The patient has an excellent recovery over the past month.  She  was given the okay to return to full levels of activity.  She will return to  see me in three months for continued surveillance or sooner if necessary.      De Blanch, M.D.  Electronically Signed     DC/MEDQ  D:  09/05/2006  T:  09/07/2006  Job:  [id number]   cc:   Telford Nab, R.N.  [address]  [place], [place] [address]

## 2011-05-10 NOTE — Consult Note (Signed)
NAMESEARA, HINESLEY            ACCOUNT NO.:  [id number]   MEDICAL RECORD NO.:  [id number]          PATIENT TYPE:  OUT   LOCATION:  GYN                          FACILITY:  [facility]   PHYSICIAN:  Paola A. Duard Brady, MD    DATE OF BIRTH:  [birth year]   DATE OF CONSULTATION:  06/17/2006  DATE OF DISCHARGE:                                   CONSULTATION   Ms. [name] is a 77 year old with history of stage IIIA vulvar carcinoma  who was initially diagnosed in January 2005.  At that time, she underwent a  left modified radical vulvectomy, left inguinal femoral lymphadenectomy at  which time she had 1/13 lymph nodes positive.  She received whole pelvic and  left inguinal groin radiation.  She has had chronic radiation vulvitis.  In  October 2006, she underwent a wide radical local excision of the right vulva  and a separate left lower vulvar excision.  She had significant wound  breakdown.  Final pathology was consistent with recurrent squamous cell  carcinoma at both sides.  There was lymphovascular space invasion.  All  margins were negative.  She was last seen by me Apr 22, 2006.  At that time,  she had an open area in the posterior portion of the vaginal fourchette  which was exquisitely tender.  HSV culture was obtained as this did have the  appearance of her herpetic lesion.  HSV culture was negative.  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  She was called ___________ which she  used one day, had significant burning.  She describes it as burned me up  and she washed it off.  Over the past two weeks, she has had increasing  vulvar pain and discharge.  She states that her left side looks funny.  She  thinks that her right vulva is infected and it looks worse then it did in  October.  She has had no fevers at home.  She does complain of a decreased  appetite and being weak, though she has gained weight on our  scales 2 pounds  and her daughter-in-law states that she has been eating quite well.   REVIEW OF SYSTEMS:  Otherwise negative.  She does have some loose stools,  occasionally has some stool incontinence.   MEDICATIONS:  Atenolol, Altace, Xanax, Celexa, Zoloft and Prilosec.   PHYSICAL EXAMINATION:  Weight 178 pounds, blood pressure 130/78.  Well-  nourished, well-developed female in no acute distress.  Groin negative for  adenopathy.  Pelvic and external genitalia is notable for multiple changes.  The entire right inner thigh area near the labia crural fold has a raised  blistered lesion containing several vesicles measuring conglomerate about 8  x 4 cm which is exquisitely tender.  There are no other hyperkeratotic or  raised lesions on the left vulva.  The right vulva in the area of a prior  excision just inside the vagina has a 3 x 1-1/2 cm raised hyperkeratotic  area that has the appearance of a recurrent vulvar  cancer.  After obtaining  the patient's consent, the area was anesthetized and a biopsy was obtained.  Hemostasis obtained using silver nitrate.   ASSESSMENT:  A 77 year old with recurrent IIIA vulvar carcinoma who also has  blistering on the left side.  The etiology of which is uncertain to me.   PLAN:  1.  Will follow up on the results of her vulvar biopsy.  We did attempt to      contact a dermatologist who was not in the office today but will call us      back tomorrow.  I am not sure what the area on the left vulva is, it      does not appear to be any vulvar carcinoma that I have had experience      with.  It also does not have the typical appearance of a radiation      necrosis as there is no ulcering and just blistering.  I have not seen      chronic blisters from radiation changes only in the acute setting.  We      will defer to Dermatology.  2.  Options regarding wide local excision were discussed with her daughter-      in-law.  We will need to wait for  dermatologic counsel as well as final      pathology to determine that disposition.  We will follow the patient      accordingly.      Paola A. Duard Brady, MD  Electronically Signed     PAG/MEDQ  D:  06/17/2006  T:  06/17/2006  Job:  [id number]   cc:   Telford Nab, R.N.  [address]  [place], [place] [address]   Kingsley Callander. Ouida Sills, MD  Fax: [phone]   Billie Lade, M.D.  Fax: [phone]   Nicholas Lose, N.P.   Roseanna Rainbow, M.D.  Fax: [phone]   Quita Skye. Artis Flock, M.D.  Fax: [phone]   S. Kyra Manges, M.D.  Fax: [phone]

## 2011-05-10 NOTE — Consult Note (Signed)
[name], [name] NO.:  [id number]   MEDICAL RECORD NO.:  [id number]          PATIENT TYPE:  OUT   LOCATION:  GYN                          FACILITY:  [facility]   PHYSICIAN:  De Blanch, M.D.DATE OF BIRTH:  02-20-1927   DATE OF CONSULTATION:  09/07/2004  DATE OF DISCHARGE:  09/07/2004                                   CONSULTATION   A 77 year old white female returns for continuing follow-up of a stage III  squamous cell carcinoma of the vulva.   INTERVAL HISTORY:  Since [name] last visit, the patient has done well.  The  vulvar pruritus has resolved using Temovate, although she does have an area  of discomfort on the right posterior vulva which she says does not improve  with Temovate application.  She has only been using [name] vaginal dilator on  rare occasion.   Otherwise, the patient seems to be doing well and has no GI or GU symptoms.   HISTORY OF PRESENT ILLNESS:  The patient underwent a left modified radical  vulvectomy and left inguinal femoral lymphadenectomy, January 2005.  She had  1 of 13 positive lymph nodes and received whole pelvis and inguinal  radiation.  She has been followed since that time with no evidence of  recurrent disease.   PAST MEDICAL HISTORY:  1.  Hypertension.  2.  Obesity.   PAST SURGICAL HISTORY:  1.  Ventral hernia repair.  2.  Cholecystectomy.  3.  Hiatal hernia repair.  4.  Hysterectomy.  5.  Radical vulvectomy.  6.  Left inguinal lymphadenectomy.   CURRENT MEDICATIONS:  Atenolol, Zoloft, Xanax, Celexa, Altace.   DRUG ALLERGIES:  None.   FAMILY HISTORY:  Negative for gynecologic breast and colon cancer.   REVIEW OF SYSTEMS:  Negative except as noted above.   PHYSICAL EXAMINATION:  VITAL SIGNS:  Weight 177 pounds, blood pressure  132/72.  GENERAL:  The patient is a healthy, moderately obese white female in no  acute distress.  HEENT:  Negative.  NECK:  Supple without thyromegaly.  There is no  supraclavicular or inguinal  adenopathy.  ABDOMEN:  Soft, nontender.  No masses, organomegaly, ascites, or hernias are  noted.  PELVIC:  EG//BUS shows a relatively extensive area of lichen sclerosis on  the anterior vulva, on the right posterior vulva.  There is an area of  ulceration which is not firm or nodular and appears to be radiation  necrosis.  This encroaches near the anus.  The vagina is otherwise normal in  texture, caliber, and length, and no lesions are noted.  Bimanual exam  reveals no masses or nodularity.   IMPRESSION:  Stage III vulvar cancer, doing well with no evidence of  recurrent disease.  I believe the patient has radiation-induced skin  breakdown on the right posterior vulva.  She is given a prescription for  Silvadene cream to be applied t.i.d. to this area.  She will report any  changes that might indicate nodularity or increasing pain or increasing size  of the lesion.  Otherwise, she will return to see me in 2 months for  continuing evaluation.  [name]   DC/MEDQ  D:  09/07/2004  T:  09/09/2004  Job:  [id number]   cc:   S. Kyra Manges, M.D.  [phone] [address]  [place]  [place] [address]  Fax: [phone]   Kingsley Callander. Ouida Sills, M.D.  [address]  [place]  [place] [address]  Fax: [phone]   Billie Lade, M.D.  [address] [facility]  [place]  [place] [address]  Fax: [phone]   Telford Nab, R.N.  [phone] [address]  [place], [place] [address]

## 2011-05-10 NOTE — Consult Note (Signed)
NAME:  [name], [name]                      ACCOUNT NO.:  [id number]   MEDICAL RECORD NO.:  [id number]                   PATIENT TYPE:  OUT   LOCATION:  GYN                                  FACILITY:  [facility]   PHYSICIAN:  De Blanch, M.D.         DATE OF BIRTH:  1927-02-02   DATE OF CONSULTATION:  06/20/2004  DATE OF DISCHARGE:                                   CONSULTATION   HISTORY OF PRESENT ILLNESS:  This 77 year old white female returns for  continuing follow up of vulvar cancer.  She really has no complaints,  although she does have a little itching on her right vulva.  She has  diarrhea when she eats green leafy vegetables in salads.  Overall, she is  slightly fatigued, but is doing well.   HISTORY OF PRESENT ILLNESS:  The patient had a modified radical vulvectomy  (left), and left inguinal femoral lymphadenectomy in January of 2005.  She  had 1 of 13 positive lymph nodes, and received whole pelvis and inguinal  radiation therapy thereafter.  She had a small amount of wound breakdown of  her vulva, which recovered without difficulty.  She has had no evidence of  __________ or lymphedema.  She does have some diarrhea after receiving  radiation therapy.   PAST MEDICAL HISTORY:  1. Hypertension.  2. Obesity.   PAST SURGICAL HISTORY:  1. Ventral hernia repair.  2. Cholecystectomy.  3. Hiatal hernia repair.  4. Hysterectomy.  5. Radical vulvectomy.  6. Left inguinal lymphadenectomy.   CURRENT MEDICATIONS:  1. Atenolol.  2. Zoloft.  3. Xanax.  4. Celexa.  5. Altace.   DRUG ALLERGIES:  None.   FAMILY HISTORY:  Negative for gynecologic, breast, or colon cancer.   REVIEW OF SYSTEMS:  Negative, except as noted above.   PHYSICAL EXAMINATION:  VITAL SIGNS:  Weight 170 pounds, blood pressure  124/76.  GENERAL:  The patient is a healthy white female in no acute distress.  HEENT:  Negative.  NECK:  Supple without thyromegaly.  LYMPHS:  There is no  supraclavicular or inguinal adenopathy.  ABDOMEN:  The inguinal incision is well headed.  LOWER EXTREMITIES:  Without edema or varicosities.  PELVIC:  EGBUS shows well-healed left radical vulvectomy site.  The right  vulva is essentially normal.  The vaginal is atrophic and clean.  Cervix and  uterus surgically absent.  Adnexa are normal without masses.   IMPRESSION:  Stage III vulvar cancer (metastatic to inguinal lymph node).  Status post resection and radiation therapy.  The patient is doing well.  She will continue to follow up with [place] in 3 months.   DISCHARGE MEDICATIONS:  She was given a prescription for Temovate cream  0.05% to be used as needed for vulvar itching.  De Blanch, M.D.    DC/MEDQ  D:  06/20/2004  T:  06/20/2004  Job:  [id number]   cc:   S. Kyra Manges, M.D.  [phone]  [place]  [place] [address]  Fax: [phone]   Telford Nab, R.N.  [address]  [place], [place] [address]

## 2011-05-10 NOTE — Consult Note (Signed)
NAME:  [name], [name]                      ACCOUNT NO.:  [id number]   MEDICAL RECORD NO.:  [id number]                   PATIENT TYPE:  OUT   LOCATION:  GYN                                  FACILITY:  [facility]   PHYSICIAN:  De Blanch, M.D.         DATE OF BIRTH:  11-18-1927   DATE OF CONSULTATION:  01/03/2004  DATE OF DISCHARGE:                                   CONSULTATION   REASON FOR CONSULTATION:  A 77 year old returns having undergone a left  radical vulvectomy and left inguinal femoral lymphadenectomy on December 27, 2003.  Final pathology showed surgical margins free of disease, although she  had 1:13 left inguinal lymph nodes with metastatic disease.   The patient reports she has done well since surgery, although she has some  drainage around her inguinal drain.  She has no vulvar symptoms except for  some itching on the contralateral (non-operated side) vulva (right).   PHYSICAL EXAMINATION:  VITAL SIGNS:  Weight 180 pounds.  ABDOMEN:  Soft, nontender, no mass, organomegaly, ascites, or hernias are  noted.  Her inguinal drain site on the left side looks healthy.  The  inguinal incision has some erythema secondary to contact with her  panniculus.  I do not believe this is a cellulitis.  The skin staples are  removed, Steri-Strips are applied.  PELVIC:  EGBUS shows the left vulva is healing well and all suture lines  remain intact.  The right vulva appears normal.   IMPRESSION:  Stage III vulvar cancer with inguinal lymph node metastases.   PLAN:  We plan on having the patient see radiation oncology for consultation  in a few weeks when she has healed further.   She will remove her Steri-Strips in a week to 10 days.  She is given  instructions on cleaning and drying the vulva and inguinal region.  She is  given a prescription for Aristocort 0.1% for the itching of the right vulva.  She will return to see [place] in about three to four weeks for continuing   followup.                                               De Blanch, M.D.    DC/MEDQ  D:  01/03/2004  T:  01/03/2004  Job:  [id number]   cc:   Telford Nab, R.N.  [address]  [place], [place] [address]

## 2011-05-10 NOTE — Consult Note (Signed)
[name], [name] NO.:  [id number]   MEDICAL RECORD NO.:  [id number]          PATIENT TYPE:  OUT   LOCATION:  GYN                          FACILITY:  [facility]   PHYSICIAN:  De Blanch, M.D.DATE OF BIRTH:  10/25/1927   DATE OF CONSULTATION:  DATE OF DISCHARGE:                                   CONSULTATION   CHIEF COMPLAINT:  Vulvar pain.   INTERVAL HISTORY:  Since her last visit, the patient has seen Dr. Roselind Messier.  She uses a radiation therapy cream on her vulva but notes persistent pain  and some thickening and lumpiness.  She denies any bleeding.   HISTORY OF PRESENT ILLNESS:  Patient had a stage IIIA squamous cell  carcinoma of the vulva, undergoing a modified left radical vulvectomy and  left inguinal femoral lymphadenectomy in January, 2005.  She had 1/13 lymph  nodes positive and received whole pelvis and inguinal radiation therapy.   PAST MEDICAL HISTORY:  1.  Hypertension.  2.  Obesity.   PAST SURGICAL HISTORY:  1.  Ventral hernia repair.  2.  Cholecystectomy.  3.  Hiatal hernia repair.  4.  Hysterectomy.  5.  Radical vulvectomy with inguinal lymphadenectomy.   CURRENT MEDICATIONS:  Atenolol, Zoloft, Xanax, Celexa, Altace, vulvar  creams.   DRUG ALLERGIES:  None.   FAMILY HISTORY:  Negative for gynecologic, breast, or colon cancer.   REVIEW OF SYSTEMS:  Negative except as noted above.   PHYSICAL EXAMINATION:  VITAL SIGNS:  Weight 175 pounds.  GENERAL:  Patient is a pleasant white female in no acute distress.  HEENT:  Negative.  NECK:  Supple without thyromegaly.  There is no supraclavicular or inguinal  adenopathy.  ABDOMEN:  Soft and nontender.  No mass, organomegaly, ascites, or hernias  are noted.  Palpation of the inguinal region, again, shows some skin changes  from her radiation but no masses or nodularity.  PELVIC:  EG/BUS shows changes of radiation therapy.  There are  telangiectasias and thickened, nodular area in the  posterior left vulva  measuring approximately 5 x 4 cm.  I do not think this is typical for  recurrent vulvar cancer but feel a biopsy is necessary.   PROCEDURE NOTE:  After 1% Xylocaine injection, a punch biopsy is obtained.  Hemostasis is achieved with silver nitrate.   PLAN:  We will await this biopsy report.  In the interval, the patient will  continue topical management.  If this is negative, she will return to see [place]  in three months for continuing followup.  In the interval, she is given a  prescription for lidocaine ointment 5% to be used twice a day p.r.n.      DC/MEDQ  D:  04/23/2005  T:  04/23/2005  Job:  [id number]   cc:   S. Kyra Manges, M.D.  [phone] [address]  [place]  [place] [address]  Fax: [phone]   Kingsley Callander. Ouida Sills, MD  [address]  [place]  [place] [address]  Fax: [phone]   Quita Skye. Artis Flock, M.D.  [address]  [place]  [place] [address]  Fax: [phone]   Telford Nab, R.N.  [address]  [place], [place] [address]

## 2011-05-10 NOTE — Consult Note (Signed)
[name], [name]            ACCOUNT NO.:  [id number]   MEDICAL RECORD NO.:  [id number]          PATIENT TYPE:  OUT   LOCATION:  GYN                          FACILITY:  [facility]   PHYSICIAN:  Paola A. Duard Brady, MD    DATE OF BIRTH:  1927-09-29   DATE OF CONSULTATION:  DATE OF DISCHARGE:                                   CONSULTATION   HISTORY:  Ms. [name] is a 77 year old with history of stage IIIA vulvar  carcinoma who was initially diagnosed in January 2005.  She underwent a left  modified radical vulvectomy, left inguinal femoral lymphadenectomy and had 1  out of 13 lymph nodes positive.  She received whole pelvic and left inguinal  groin radiation.  She has chronic radiation vulvitis.  In October2006, she  underwent a wide radical local excision of the right vulva and a separate  left lower vulvar lesion excision.  She had significant wound breakdown and  final pathology was consistent with recurrent squamous cell carcinoma at  both sites.  LVSI was present, all margins were negative but because of  prior radiation to the right groin it was elected to follow her  conservatively.  She was last seen by Dr. Kyla Balzarine on [phone] at which  time her exam was negative for any evidence of recurrent carcinoma.  She  comes in today for followup.  She is complaining of pain in the left  buttock.  She states this is the same area as the pimple that she felt  before when nothing could be seen.  She does complain of some dysuria which  has been a longstanding issue for her.  She does use a sitz bottle to assist  her with voiding.  She denies any bleeding or any other complaints.  She has  been gaining a significant amount of weight but she states she is on vitamin  supplements and that has caused her to gain weight.  She recently underwent  an upper and lower endoscopy which per her report were unremarkable.  These  were performed secondary to diarrhea and rectal bleeding and she is under  the care of Dr. Karilyn Cota.   MEDICATIONS:  Include atenolol, Altace, Xanax, Celexa, Zoloft, and Prilosec.   ALLERGIES:  NONE.   PHYSICAL EXAMINATION:  VITALS:  Weight 176 pounds.  GENERAL:  Well-nourished, well-developed female in no acute distress.  Groins are negative for adenopathy.  PELVIC:  External genitalia is notable for postsurgical changes.  She has  diffuse erythema consistent with radiation changes.  On the left buttock she  has a series of small vesicles in a cluster consistent with a possible  herpetic outbreak.  None of these are scabbed over.  On the vulva itself she  has an open area at the posterior portion of the vaginal fourchette.  There  is some pale exudate overlying this area.  On the right vulva she does have  a hyperkeratotic raised lesion.  After obtaining her verbal consent both HSV  cultures were obtained.  The lesion on her right side measures approximately  1 x 1 cm.  The area was anesthetized  and a biopsy of the vulva was  performed.  Hemostasis obtained using silver nitrate.   ASSESSMENT:  A 77 year old with history of a recurrent vulvar carcinoma  initially stage IIIA who may at this point have another recurrence on the  right vulva.  She also may have a herpetic outbreak.   PLAN:  1.  We will follow up the results of her herpes culture.  She was given a      prescription for Valtrex.  2.  We will call her with the results of her vulvar biopsy from today.      Paola A. Duard Brady, MD  Electronically Signed     PAG/MEDQ  D:  04/22/2006  T:  04/22/2006  Job:  [id number]   cc:   Telford Nab, R.N.  [address]  [place], [place] [address]   Billie Lade, M.D.  Fax: [phone]

## 2011-05-10 NOTE — Consult Note (Signed)
NAME:  [name], [name]                      ACCOUNT NO.:  [id number]   MEDICAL RECORD NO.:  [id number]                   PATIENT TYPE:  OUT   LOCATION:  GYN                                  FACILITY:  [facility]   PHYSICIAN:  De Blanch, M.D.         DATE OF BIRTH:  10-Jul-1927   DATE OF CONSULTATION:  DATE OF DISCHARGE:                                   CONSULTATION   This 77 year old returns for continuing follow up of vulvar cancer (stage  III).   INTERVAL HISTORY:  Since her last visit the patient has done well. Her only  complaint is that of some vaginal discharge. She denies any fever or chills  and has had no edema of her legs.  Functional status is improving steadily.   HISTORY OF PRESENT ILLNESS:  The patient underwent a left radical vulvectomy  and a left inguinal/femoral lymphadenectomy on December 27, 2003.  Her drain  came out last week. Last final pathology showed surgical margins free of  disease, although she had one of thirteen left inguinal nodes with  metastatic disease.   PAST MEDICAL HISTORY:  Hypertension.   PAST SURGICAL HISTORY:  Ventral hernia repair, cholecystectomy, hiatal  hernia repair, hysterectomy with bilateral salpingo-oophorectomy, radical  vulvectomy.   CURRENT MEDICATIONS:  1. Atenolol.  2. Zoloft.  3. Xanax.  4. Celexa.  5. Altace.   DRUG ALLERGIES:  None.   FAMILY HISTORY:  Negative for gynecologic, breast, or colon cancer.   SOCIAL HISTORY:  The patient is widowed. She comes in accompanied by her  daughter today.   REVIEW OF SYSTEMS:  Negative except as noted above.   PHYSICAL EXAMINATION:  VITAL SIGNS: Weight 179 pounds, blood pressure  150/90.  GENERAL: The patient is a healthy elderly white female in no acute distress.  HEENT: Negative.  NECK: Supple without thyromegaly.  ABDOMEN: The left inguinal incision is healing well. There is no adenopathy.  The drain site is also healing well.  EXTREMITIES: Inspection of the  lower extremities reveal no edema.  PELVIC EXAM: EGBUS shows separation of the suture line in the posterior-  inferior aspect of the incision. The anterior vulva is healing nicely. There  is no evidence of infection. Some of the Vicryl sutures are removed without  difficulty. Bimanual examination reveals the vagina without any nodularity.   IMPRESSION:  Stage III vulvar cancer with metastatic disease in one of  thirteen inguinal lymph nodes. The patient is scheduled to see Dr. Roselind Messier  tomorrow to discuss radiation therapy and will initiate that shortly  thereafter.   With regard to her suture line separation, the patient will begin sitz baths  two to three times a day and will apply Silvadene cream to this area after  each sitz bath. She will return to see [place] in four weeks for continuing  followup.  De Blanch, M.D.    DC/MEDQ  D:  01/25/2004  T:  01/25/2004  Job:  [id number]   cc:   Billie Lade, M.D.  [address] - [facility]  [place]  [place] [address]  Fax: [phone]   Telford Nab, R.N.  [phone] [address]  [place], [place] [address]

## 2011-05-10 NOTE — Consult Note (Signed)
NAMECATLYNN, GRONDAHL NO.:  [id number]   MEDICAL RECORD NO.:  [id number]          PATIENT TYPE:  OUT   LOCATION:  GYN                          FACILITY:  [facility]   PHYSICIAN:  De Blanch, M.D.DATE OF BIRTH:  June 02, 1927   DATE OF CONSULTATION:  DATE OF DISCHARGE:                                 CONSULTATION   CHIEF COMPLAINT:  Vulvar cancer, diarrhea.   INTERVAL HISTORY:  Since her last visit, the patient reports that her  vulva feels better and has completely healed.  Her biggest complaint is  that of frequent diarrhea several times a day.  She has been using  Imodium but only once every several days. She apparently has a fairly  large intake of yogurt as well as green leafy vegetables, spinach, and  salads.  She has been previously advised to avoid these elements of her  diet in hopes of improving her diarrhea.  She notes that in some cases  she is incontinent of stool.  She also has slight amount of urinary  incontinence and recently, on examining herself, questioned whether her  bladder had fallen.   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  At that time, she  underwent a modified left radical vulvectomy and left inguinal femoral  lymphadenectomy.  She had 1 out of 13 nodes positive and, therefore,  received whole pelvis and left inguinal radiation therapy.  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  On follow-up  exams, the patient was found to have a recurrence on her vulva in  November 2006.  This was found on the right vulva with two exophytic  lesions, one of which was invasive carcinoma moderately differentiated  measuring 0.7 cm and 1 mm in invasion, margins were negative.  The  patient had some wound breakdown.  This required further debridement and  whirlpool treatment.   She was then followed after complete wound healing until yet  another  recurrence was identified in the left posterior vulva in May 2007.  She  underwent a modified right radical vulvectomy with excision of a 3-4 cm  exophytic lesion.  Final pathology showed the depth of invasion 0.4 cm  and the lesion measuring 2.7 cm in diameter.  There was evidence of  lymph, vascular, and perineural invasion.  Surgical margins were  negative.  She, again, had a superficial wound separation which has  healed completely.   PAST MEDICAL HISTORY:  Hypertension and diabetes.   DRUG ALLERGIES:  None.   CURRENT MEDICATIONS:  Zoloft, Xanax, Celexa, Altace, Prilosec and  atenolol.   FAMILY HISTORY:  Negative for gynecologic, breast, or colon cancers.   SOCIAL HISTORY:  The patient is widowed.  She comes accompanied by her  son and daughter today.  She does not smoke.   REVIEW OF SYSTEMS:  Ten point comprehensive review of systems negative  except as noted above.   PHYSICAL EXAMINATION:  VITAL SIGNS:  Weight 172 pounds.  GENERAL:  The patient is a pleasant elderly white female in no  acute  distress.  HEENT:  Negative.  NECK:  Supple without thyromegaly, there is no supraclavicular  adenopathy.  Palpation of the inguinal regions reveal some shoddiness in  the right inguinal region without discrete lymphadenopathy.  ABDOMEN:  Soft, nontender.  No mass, hepatomegaly, ascites or herniae  noted.  PELVIC:  EG and BUS is status post vulvectomy and radiation changes.  There are no gross lesions noted.  It is noted that the anal sphincter  is absent anteriorly and there is a scar banding between the vagina and  the anal sphincter and the rectovaginal septum.  No gross lesions noted.  There is a minimal amount of urethral prolapse.   IMPRESSION:  1. Recurrent vulvar cancer, currently free of disease.  2. Chronic diarrhea.   I instructed the patient and her daughter as to dietary modifications  necessary to improve her status including elimination of yogurt, green   leafy vegetables, and fried foods.  In addition, she is encouraged to  increase her use of Imodium to at least 4 tablets a day as needed  following each bowel movement.  She will try this new regimen for  several weeks and report back to [place] as to how she is doing. Certainly  Lomotil could be added to the regimen or some other approaches including  Benafiber.  She will return to see [place] for routine checkup in three  months.      De Blanch, M.D.  Electronically Signed     DC/MEDQ  D:  12/03/2006  T:  12/03/2006  Job:  [id number]   cc:   Telford Nab, R.N.  [address]  [place], [place] [address]   Kingsley Callander. Ouida Sills, MD  Fax: [phone]   Billie Lade, M.D.  Fax: [phone]

## 2011-05-10 NOTE — Discharge Summary (Signed)
NAME:  BETHANNE, MULE                      ACCOUNT NO.:  [id number]   MEDICAL RECORD NO.:  [id number]                   PATIENT TYPE:  INP   LOCATION:  0484                                 FACILITY:  [facility]   PHYSICIAN:  De Blanch, M.D.         DATE OF BIRTH:  1927/01/18   DATE OF ADMISSION:  12/27/2003  DATE OF DISCHARGE:  12/29/2003                                 DISCHARGE SUMMARY   HOSPITAL COURSE:  Following admission the patient was taken to the operating  room where she underwent a left modified radical vulvectomy and left  inguinofemoral lymphadenectomy.  Blood loss was 100 mL.  The patient had an  essentially uncomplicated postoperative course, ambulating on the 1st  postoperative day.  Pain was ultimately controlled using Percocet, and her  diet was advanced to a regular diet within 24 hours.  Bowel and bladder  function were reestablished after removal of the Foley catheter, and the  patient was then discharged.   DISCHARGE INSTRUCTIONS:  The patient is instructed on management of a Blake  drain in the left inguinal region.  She will record outputs and report this  to [place].  She will continue to ambulate frequently and resume a regular diet.  She is given a prescription for Vicodin 1 tablet every 4 hours as needed for  pain control.  She will return in 1 week for removal of staples from her  groin.   Final pathology showed the patient had moderately differentiated invasive  squamous cell carcinoma with negative margins and 1 positive lymph node.  We  will plan on giving her inguinal and pelvic radiation therapy when she  recovers from surgery.   FINAL DIAGNOSIS:  Stage III squamous cell carcinoma of the vulva with  inguinal metastases.   CONDITION AT DISCHARGE:  Improved.                                               De Blanch, M.D.    DC/MEDQ  D:  01/31/2004  T:  01/31/2004  Job:  [id number]   cc:   Michele Mcalpine D. Okey Dupre, M.D.  Fax: [phone]   Telford Nab, R.N.  [address]  [place], [place] [address]   Protocol [facility]

## 2011-05-10 NOTE — Consult Note (Signed)
NAME:  [name], [name]                      ACCOUNT NO.:  [id number]   MEDICAL RECORD NO.:  [id number]                   PATIENT TYPE:  OUT   LOCATION:  GYN                                  FACILITY:  [facility]   PHYSICIAN:  De Blanch, M.D.         DATE OF BIRTH:  [birth year]   DATE OF CONSULTATION:  DATE OF DISCHARGE:                                   CONSULTATION   A 77 year old returns for continued followup of vulvar cancer.   INTERVAL HISTORY:  Since her last visit, the patient had done well.  She has  just begun radiation therapy to the pelvis and inguinal region because of  one positive inguinal lymph node.  She denies any lymph edema.  She does  have some pruritus of her vulva.   HISTORY OF PRESENT ILLNESS:  The patient underwent a left modified radical  vulvectomy and left inguinal femoral lymphadenectomy, January 2005.  Final  pathology showed margins of the vulva free of disease.  She did have 1 of 13  left inguinal nodes that had metastatic deposit in it.   PAST MEDICAL HISTORY:  Medical illnesses:  1. Hypertension.  2. Obesity.   PAST SURGICAL HISTORY:  1. Ventral hernia repair.  2. Cholecystectomy.  3. Hiatal hernia repair.  4. Hysterectomy with BSO.  5. Radical vulvectomy.  6. Left inguinal lymphadenectomy.   CURRENT MEDICATIONS:  Atenolol, Zoloft, Xanax, Celexa, Altace.   DRUG ALLERGIES:  None.   FAMILY HISTORY:  Negative for gynecologic, breast, or colon cancer.   SOCIAL HISTORY:  The patient is widowed.  She comes accompanied by her  daughter who runs a Aeronautical engineer business.   REVIEW OF SYSTEMS:  Negative except as noted above.   PHYSICAL EXAMINATION:  VITAL SIGNS:  Weight 174 pounds, blood pressure  140/90.  GENERAL:  The patient is a healthy white female in no acute distress.  HEENT:  Negative.  NECK:  Supple without thyromegaly.  LYMPHATICS:  There is no supraclavicular or inguinal adenopathy.  ABDOMEN:  Obese, soft, nontender.  No  mass, organomegaly, ascites or hernias  are noted.  Her inguinal incision is well healed.  EXTREMITIES:  Lower extremities are without edema or varicosities.  PELVIC:  EG, BUS, vagina, bladder, urethra are normal but status post  radical vulvectomy.  All incisions are well healed.  Vagina is clean.  No  lesions noted.  Bimanual and rectovaginal exam reveal no masses, induration,  or nodularity.   IMPRESSION:  Stage III vulvar cancer, currently receiving radiation therapy.  The patient is free of disease.   Leg measurements are taken per __________  protocol and recorded on the data  sheet.   It is my clinical impression that the patient has no evidence of lymph edema  or any leg problems.   PLAN:  The patient will return to see [place] in three months for continuing  followup.  De Blanch, M.D.    DC/MEDQ  D:  02/22/2004  T:  02/23/2004  Job:  [id number]   cc:   Billie Lade, M.D.  [address] - [facility]  [place]  [place] [address]  Fax: [phone]   S. Kyra Manges, M.D.  [phone] [address]  [place]  [place] [address]  Fax: [phone]   Kingsley Callander. Ouida Sills, M.D.  [address]  [place]  [place] [address]  Fax: [phone]   Telford Nab, R.N.  [address]  [place], [place] [address]

## 2011-05-10 NOTE — Op Note (Signed)
NAME:  [name], [name]                      ACCOUNT NO.:  [id number]   MEDICAL RECORD NO.:  [id number]                   PATIENT TYPE:  AMB   LOCATION:  DAY                                  FACILITY:  [facility]   PHYSICIAN:  De Blanch, M.D.         DATE OF BIRTH:  02-Jul-1927   DATE OF PROCEDURE:  12/27/2003  DATE OF DISCHARGE:                                 OPERATIVE REPORT   PREOPERATIVE DIAGNOSIS:  Stage II squamous cell carcinoma of the vulva.   POSTOPERATIVE DIAGNOSIS:  Stage II squamous cell carcinoma of the vulva.   PROCEDURE:  Left modified radical vulvectomy, left inguinal-femoral  lymphadenectomy.   SURGEON:  De Blanch, M.D.   ASSISTANTMichele Mcalpine D. Okey Dupre, M.D., Telford Nab, R.N.   ANESTHESIA:  General with orotracheal tube.   ESTIMATED BLOOD LOSS:  100 cc.   SURGICAL FINDINGS:  Examination under anesthesia revealed an exophytic  lesion involving the left labia majora and minora measuring approximately 5  cm in diameter.  The lesion did not extend to the anus or the urethra or  into the vagina.  In the course in doing the inguinal-femoral  lymphadenectomy, multiple firm lymph nodes were encountered.   DESCRIPTION OF PROCEDURE:  The patient was taken to the operating room and  after satisfactory attainment of general anesthesia was placed in the  modified position in [place] stirrups.  The anterior abdominal wall, inguinal  region, thighs, and vulva and vagina were prepped with Betadine, and the  patient was draped.  A Foley catheter was inserted sterilely.  The patient  was then repositioned in a lithotomy position.  A modified radical left  vulvectomy was performed excising vulvar skin and subcutaneous tissue down  to the pelvic floor beginning laterally at the labial crural fold and  medially at the introitus.  This extended to within 0.5 cm of the urethra  and approximately 1 cm past the midline in the perineum in order to obtain  adequate  surgical margins.  The pudendal arteries were identified, clamped,  cut, and suture ligated.  Additional bleeding from the bulbocavernosus  muscle was controlled with figure-of-eight sutures of 2-0 Vicryl.  Subcutaneous tissue was irrigated.  Additional hemostasis was achieved with  cautery.  The subcutaneous tissue was then reapproximated with interrupted  and figure-of-eight sutures of 2-0 Vicryl.  The skin was then closed under  only minimal tension with interrupted sutures of 3-0 Vicryl.   The patient was then repositioned into a position where her thighs were  parallel to the floor.  Gloves and gowns were changed as were all  instruments and a new table of instruments was used.  An incision was made  parallel to the inguinal ligament and carried down to Campers fascia.  Campers fascia was incised, and then a skin flap was developed cephalad to  the anterior abdominal wall rectus fascia.  The dissection then removed  lymph nodes down to the inguinal ligament.  The dissection  was carried  laterally and down to the fascia of the tensor fascia lata muscle.  It was  carried medial to the pubic tubercle.  The adductor longus muscle and its  fascia was then identified, thus outlining the femoral triangle.  Laterally,  we incised the fascia of the fascia lata, and then the dissection was  carried from lateral to medial until the femoral artery was identified.  The  sheath around the femoral artery was incised.  We then moved further  medially identifying the femoral vein as it passed beneath the inguinal  ligament.  The lymph nodes were dissected off the femoral vein.  The trunk  of the saphenous vein was identified, clamped, cut, and suture ligated using  2-0 Vicryl suture.  The remainder of the lymph nodes were dissected free.  In the course of the distal dissection, two branches of the saphenous vein  were identified, clamped, cut, and suture ligated.  All the lymph node in  the  inguinal-femoral region were then removed and submitted to pathology.  The vulvar specimen was oriented on a corkboard for the pathologist.   The subcutaneous tissue in the inguinal region was then irrigated and  hemostasis achieved with cautery.  A Blake drain was placed through a stab  incision in the lower abdominal wall.  Then 2-0 Vicryl suture were used to  reapproximate the Campers fascia but held open.  The patient had previously  consented to participate in a gynecologic oncology group protocol and had  been randomized to have Tisseal sprayed into the inguinal incision.   Then 4 cc of Tisseal was then sprayed into the inguinal region, the  subcutaneous sutures were then tied, and then pressure applied to the  inguinal region for five minutes as being observed by the clock.  The skin  was then closed with skin staples.  An ice pack was placed on the vulva and  dressing on the inguinal region.  The drain was sutured to the skin using 2-  0 silk.  The patient was awakened from anesthesia and taken to the recovery  room in satisfactory condition.  Sponge, needle, and instruments counts were  correct x2.                                               De Blanch, M.D.    DC/MEDQ  D:  12/27/2003  T:  12/27/2003  Job:  [id number]   cc:   Michele Mcalpine D. Okey Dupre, M.D.  Fax: [phone]   S. Kyra Manges, M.D.  [phone] [address]  [place]  [place] [address]  Fax: [phone]   Telford Nab, R.N.  [address]  [place], [place] [address]

## 2011-05-10 NOTE — Consult Note (Signed)
NAMEVERNETA, HAMIDI NO.:  [id number]   MEDICAL RECORD NO.:  [id number]          PATIENT TYPE:  OUT   LOCATION:  GYN                          FACILITY:  [facility]   PHYSICIAN:  De Blanch, M.D.DATE OF BIRTH:  16-Dec-1927   DATE OF CONSULTATION:  01/21/2006  DATE OF DISCHARGE:                                   CONSULTATION   This 77 year old white female returns for follow-up of partial radical  vulvectomy. She had some wound breakdown but has recovered from that nicely  with conservative care. The patient's functional status is very good. She  has returned to her baseline level of activity.   HISTORY OF PRESENT ILLNESS:  In January2005, the patient underwent left  inguinal femoral lymphadenectomy and left modified radical vulvectomy. She  had one positive lymph node out of 13 (stage IIIA). She received whole  pelvis and left inguinal radiation therapy. She has had some difficulties  with radiation bulbitis. She subsequently had a recurrence in October2006  and underwent a wide radical local excision of the right vulvar lesion and a  separate left posterior vulvar lesion. She had some wound breakdown  requiring conservative management and wound care. Final pathology showed the  right vulva to be consistent with invasive poorly differentiated squamous  cell carcinoma measuring 1.2 cm in lateral extent and 0.4 cm in depth of  invasion. She has separate nodule carcinoma in situ on the left posterior  vulva. There was lymph vascular involvement. The margins were negative.   Past medical history, past surgical history, social history, family history  and current medications are reviewed and unchanged from my most recent note.   REVIEW OF SYSTEMS:  Ten-point comprehensive review of systems is negative  except as noted above. GENERAL:  The patient is a healthy but elderly white  female in no acute distress. HEENT:  Negative. NECK:  Supple without  thyromegaly.  There is no supraclavicular or inguinal adenopathy. ABDOMEN:  Soft, nontender. No mass, organomegaly, or hernias noted.  Her inguinal  incisions are all well-healed. PELVIC:  EG/BUS shows the vulva is entirely  healed. No new lesions are noted. Vagina is atrophic and clean.  Bimanual  rectovaginal exam reveals no mass, induration. Good postoperative recovery  following wound breakdown.   ASSESSMENT:  The patient has recurrent vulvar cancer with 4 mm of invasion  and lymph vascular space involvement. She has not had a right inguinal  femoral lymphadenectomy. However, given the fact that she had  radiation therapy in the right groin, I believe that she carries a  significant risk for wound breakdown and potential vessel necrosis. We have  therefore elected to follow her closely to palpation but will not perform an  inguinal lymphadenectomy at this time. She will return to see me in 3 months  for continuing follow-up.      De Blanch, M.D.  Electronically Signed     DC/MEDQ  D:  01/21/2006  T:  01/22/2006  Job:  [id number]   cc:   Telford Nab, R.N.  [address]  [place], [place] [address]

## 2011-05-10 NOTE — Consult Note (Signed)
[name], [name] NO.:  [id number]   MEDICAL RECORD NO.:  [id number]          PATIENT TYPE:  OUT   LOCATION:  GYN                          FACILITY:  [facility]   PHYSICIAN:  John T. Kyla Balzarine, M.D.    DATE OF BIRTH:  10-Feb-1927   DATE OF CONSULTATION:  03/04/2006  DATE OF DISCHARGE:                                   CONSULTATION   CHIEF COMPLAINT:  This patient returns following therapy for follow-up of  recurrent vulvar cancer with a new left vulvar pimple that has concerned  her that it might represent recurrent cancer.   HISTORY OF PRESENT ILLNESS:  In January 2005, the patient underwent a left  modified radical vulvectomy and left inguinofemoral lymphadenectomy. She had  1 positive node out of 13 (stage IIIA disease) and received whole pelvis and  left inguinal radiation. She has chronic radiation vulvitis. In October  2006, she underwent wide radical local resection of right vulvar and a  separate left posterior vulvar lesions. 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 Lymph  vascular involvement was present and margins were negative but because of  prior radiation in the right groin, it was elected to follow her  conservatively. Past history, personal social history, family history and  current medications are reviewed and unchanged from those recorded during  her admission to [facility].   She continues Zoloft, Xanax, Celexa and Altace.   ALLERGIES:  None.   REVIEW OF SYSTEMS:  The patient notes she has had GI upset and epigastric  discomfort. She has a remote history of a bleeding ulcer which was  diagnosed endoscopically in the past. She has had episodes of diarrhea with  black stools and will be seeing her primary care physician in the near  future for evaluation of these symptoms. Otherwise, comprehensive review of  systems negative in 10 systems.   PHYSICAL EXAMINATION:  VITAL SIGNS:  Weight 172  pounds, blood pressure  120/78, temperature afebrile, respirations 18.  GENERAL:  The patient is extremely anxious, alert and oriented x3 in no  acute distress. There is no pathologic lymphadenopathy. There is no back or  CVA tenderness.  ABDOMEN:  Soft and benign with no hernia, ascites, tenderness or mass.  EXTREMITIES:  Full strength and range of motion without edema.  PELVIC:  External genitalia are dominated by loss of architecture from prior  resections. There is patchy hyperkeratosis between the rectum and vagina and  telangiectasia compatible with radiation change. No suspicious lesions are  observed or palpated. Groins bilaterally are negative. Vaginal mucosa is  atrophic and clear. Bimanual and rectovaginal examinations disclose absent  uterus and cervix with no mass or nodularity.   ASSESSMENT:  Vulvar carcinoma with radiation dermatitis. The patient noted  that the concerning lesion has resolved over the past few days.   RECOMMENDATIONS:  I recommended that the patient institute a rinse/blot/blow  dry regimen at least twice daily. She should see her primary care physician  for evaluation of her melena and will see Dr. Loree Fee for follow-up of  her vulvar  cancer in 2 months.      John T. Kyla Balzarine, M.D.  Electronically Signed     JTS/MEDQ  D:  03/04/2006  T:  03/05/2006  Job:  [id number]   cc:   Telford Nab, R.N.  [address]  [place], [place] [address]   Kingsley Callander. Ouida Sills, MD  Fax: [phone]   S. Kyra Manges, M.D.  Fax: [phone]   Quita Skye. Artis Flock, M.D.  Fax: [phone]   Billie Lade, M.D.  Fax: [phone]   Roseanna Rainbow, M.D.  Fax: [phone]

## 2011-05-10 NOTE — Consult Note (Signed)
NAMESHENICKA, SUNDERLIN NO.:  [id number]   MEDICAL RECORD NO.:  [id number]          PATIENT TYPE:  OUT   LOCATION:  GYN                          FACILITY:  [facility]   PHYSICIAN:  De Blanch, M.D.DATE OF BIRTH:  1927-04-01   DATE OF CONSULTATION:  12/11/2005  DATE OF DISCHARGE:  12/11/2005                                   CONSULTATION   CHIEF COMPLAINT:  Wound infection.   INTERVAL HISTORY:  Since hospital discharge from [facility], the patient has been a  nursing home where she has been having her vulvar wound packed twice a day  with gauze.  She seems to be doing well.  She denies any fever or chills or  any significant pelvic pain, pressure, vaginal bleeding. or discharge.   PHYSICAL EXAMINATION:  VITAL SIGNS:  Weight 164 pounds.  PELVIC EXAM:  EG/BUS shows that both lesions which had opened up. are now  granulating beautifully.  In fact, the gauze seems to be pulling away  healthy tissue.   PLAN:  We will discontinue packing.  We will have the patient irrigate her  vulva 3 times a day and after each avoid and bowel movement, she will apply  Silvadene cream 3 times a day to these 2 open areas.  She will return to see  me in approximately 1 month.  We have encouraged her to stay in the nursing  home for approximately 2 more weeks to improve wound care.      De Blanch, M.D.  Electronically Signed     DC/MEDQ  D:  12/11/2005  T:  12/14/2005  Job:  [id number]   cc:   Telford Nab, R.N.  [address]  [place], [place] [address]

## 2011-05-10 NOTE — Op Note (Signed)
[name], [name]            ACCOUNT NO.:  [id number]   MEDICAL RECORD NO.:  [id number]          PATIENT TYPE:  AMB   LOCATION:  DAY                          FACILITY:  [facility]   PHYSICIAN:  De Blanch, M.D.DATE OF BIRTH:  1927/04/24   DATE OF PROCEDURE:  11/05/2005  DATE OF DISCHARGE:                                 OPERATIVE REPORT   PREOPERATIVE DIAGNOSIS:  Three vulvar lesions.   POSTOPERATIVE DIAGNOSIS:  Probable recurrent vulvar carcinoma, pending final  pathology.   PROCEDURE:  Right modified radical vulvectomy. Wide local excision of left  posterior vulvar lesion.   SURGEON:  De Blanch, M.D.   ASSISTANT:  Telford Nab, R.N.   ANESTHESIA:  General with orotracheal tube.   ESTIMATED BLOOD LOSS:  20 mL.   FINDINGS:  Examination under anesthesia revealed the patient had previously  had a left modified radical vulvectomy. On the right vulva, there were two  lesions, both exophytic and measuring approximately a centimeter and a half  in diameter. There and was also a 1 1/2 cm lesion on the left vulva which  was raised and thickened. The vulva was previously radiated and had sequelae  of radiation therapy.   DESCRIPTION OF PROCEDURE:  The patient was brought to the operating room and  after satisfactory attainment of general anesthesia was placed in lithotomy  position in [place] stirrups. The vulva, vagina and perineum were prepped with  Betadine and the patient was draped. An elliptical skin incision was then  made excising the right vulva incorporating both vulvar lesions. After  making the skin incision, cautery was used to divide the deep subcutaneous  tissue and bulbocavernosus muscle. This was oriented on cork for pathologic  evaluation. The subcutaneous tissue was then reapproximated with interrupted  2-0 Vicryl sutures. The skin was reapproximated with 3-0 Vicryl Rapide.   The left vulva was then inspected and found to have lesion a  posteriorly. An  elliptical incision was made around this lesion and this was excised  superficially. The subcutaneous tissue was reapproximated with 2-0 Vicryl  and skin was  closed with 3-0 Vicryl Rapide. The vulva was washed and an ice pack was  placed on the vulva. The patient was awakened from anesthesia and taken to  the recovery room in satisfactory condition. Sponge, needle and instrument  counts correct x2.      De Blanch, M.D.  Electronically Signed     DC/MEDQ  D:  11/05/2005  T:  11/05/2005  Job:  [id number]   cc:   S. Kyra Manges, M.D.  Fax: [phone]   Telford Nab, R.N.  [address]  [place], [place] [address]

## 2011-05-10 NOTE — Consult Note (Signed)
[name], [name] NO.:  [id number]   MEDICAL RECORD NO.:  [id number]          PATIENT TYPE:  OUT   LOCATION:  GYN                          FACILITY:  [facility]   PHYSICIAN:  De Blanch, M.D.DATE OF BIRTH:  03-09-1927   DATE OF CONSULTATION:  DATE OF DISCHARGE:                                   CONSULTATION   CHIEF COMPLAINT:  Vulvar pain, history of vulvar cancer.   INTERVAL HISTORY:  Since her last visit, she has developed a new lesion on  the right vulva, which is quite painful.  She has been using lidocaine jelly  with some relief.  She recently saw Dr. Roselind Messier, who identified the lesion  and has referred her back to [place] for further evaluation.  On that visit, she  had a Pap smear which was normal.  She denies any vaginal bleeding, although  she does have some discharge and wears a pad nearly continually.  She has no  other symptoms.   HISTORY OF PRESENT ILLNESS:  The patient underwent initial surgery for stage  IIIA squamous cell carcinoma of the vulva in January, 2005.  She underwent a  modified left radical vulvectomy and left inguinal femoral lymphadenectomy.  She had 1 of 13 lymph nodes positive and received whole pelvis and left  inguinal radiation therapy.  Subsequently, she has had some radiation  vulvitis problems.  Recent biopsy showed lichen sclerosis.   PAST MEDICAL HISTORY:  1.  Hypertension.  2.  Obesity.   PAST SURGICAL HISTORY:  1.  Ventral hernia repair.  2.  Cholecystectomy.  3.  Hiatal hernia repair.  4.  Hysterectomy.  5.  Left radical vulvectomy and inguinal lymphadenectomy.   CURRENT MEDICATIONS:  Atenolol, Zoloft, Xanax, Celexa, Altace, and lidocaine  cream.   DRUG ALLERGIES:  None.   FAMILY HISTORY:  Negative for gynecologic, breast, or colon cancer.   REVIEW OF SYSTEMS:  A 10-point comprehensive review of systems is performed  and is negative, except as noted above.   PHYSICAL EXAMINATION:  VITAL SIGNS:  Weight  178 pounds.  Blood pressure  136/84.  GENERAL:  The patient is a healthy white female in no acute distress.  HEENT:  Negative.  NECK:  Supple without thyromegaly.  There is no supraclavicular or inguinal  adenopathy.  ABDOMEN:  The left inguinal incision is well healed.  PELVIC:  EG/BUS status post left vulvectomy.  The remainder of the vulva is  atrophic.  In the center of the right labia majora is a 1 cm, somewhat  necrotic lesion, which is exceedingly tender.  Above this area is an  approximately 2 cm area of exophytic tissue appearing to be hyperkeratotic.  The vagina is otherwise atrophic.  No lesions are noted.  The urethra  appears normal.  Bimanual and rectovaginal exam reveals no masses,  induration, or nodularity.  EXTREMITIES:  Lower extremities have 1+ edema in the left lower extremity.   IMPRESSION:  Two new right vulvar lesions, one of which is quite painful and  is concerning for recurrent disease.  I do not think this can be easily  biopsied in  the clinic, given the patient's pain; therefore, we will  schedule her to undergo outpatient wide local excision of both lesions in  the next week or so.  I have discussed this all with the patient and her  son, and they are in agreement with our management plan.      De Blanch, M.D.  Electronically Signed     DC/MEDQ  D:  10/22/2005  T:  10/22/2005  Job:  [id number]   cc:   Telford Nab, R.N.  [address]  [place], [place] [address]   Kingsley Callander. Ouida Sills, MD  Fax: [phone]   S. Kyra Manges, M.D.  Fax: [phone]   Quita Skye. Artis Flock, M.D.  Fax: [phone]   Billie Lade, M.D.  Fax: [phone]

## 2011-09-16 LAB — CBC
HCT: 38.9
HCT: 40
HCT: 33.7 — ABNORMAL LOW
HCT: 38.2
HCT: 39.3
HCT: 40
Hemoglobin: 12.9
Hemoglobin: 13.1
Hemoglobin: 13.4
Hemoglobin: 11.4 — ABNORMAL LOW
Hemoglobin: 12.6
Hemoglobin: 13.1
Hemoglobin: 13.1
MCHC: 32.8
MCHC: 33.3
MCV: 84
MCV: 84.2
MCV: 84.2
MCV: 83.6
Platelets: 384
Platelets: 450 — ABNORMAL HIGH
Platelets: 254
RBC: 4.49
RBC: 4.71
RBC: 4.76
RBC: 4.09
RBC: 4.56
RDW: 18.3 — ABNORMAL HIGH
RDW: 18.6 — ABNORMAL HIGH
WBC: 26.3 — ABNORMAL HIGH
WBC: 50 — ABNORMAL HIGH
WBC: 55.5
WBC: 94.6
WBC: 92.5

## 2011-09-16 LAB — DIFFERENTIAL
Band Neutrophils: 34 — ABNORMAL HIGH
Band Neutrophils: 36 — ABNORMAL HIGH
Band Neutrophils: 36 — ABNORMAL HIGH
Basophils Absolute: 0
Basophils Absolute: 0
Basophils Relative: 0
Basophils Relative: 0
Basophils Relative: 0
Blasts: 0
Eosinophils Absolute: 0
Eosinophils Relative: 1
Lymphocytes Relative: 4 — ABNORMAL LOW
Lymphocytes Relative: 5 — ABNORMAL LOW
Lymphocytes Relative: 7 — ABNORMAL LOW
Lymphs Abs: 1.7
Lymphs Abs: 2
Lymphs Abs: 4.7 — ABNORMAL HIGH
Metamyelocytes Relative: 1
Metamyelocytes Relative: 1
Monocytes Absolute: 4 — ABNORMAL HIGH
Monocytes Absolute: 4.7 — ABNORMAL HIGH
Monocytes Relative: 5
Monocytes Relative: 8
Monocytes Relative: 8
Monocytes Relative: 2 — ABNORMAL LOW
Neutro Abs: 22.5 — ABNORMAL HIGH
Neutro Abs: 45.4 — ABNORMAL HIGH
Neutrophils Relative %: 86 — ABNORMAL HIGH
Neutrophils Relative %: 94 — ABNORMAL HIGH
Promyelocytes Absolute: 0
Smear Review: INCREASED
Smear Review: INCREASED
Smear Review: ADEQUATE
WBC Morphology: INCREASED
WBC Morphology: INCREASED

## 2011-09-16 LAB — POCT I-STAT 3, ART BLOOD GAS (G3+)
Acid-base deficit: 11 — ABNORMAL HIGH
Acid-base deficit: 3 — ABNORMAL HIGH
Acid-base deficit: 5 — ABNORMAL HIGH
Acid-base deficit: 6 — ABNORMAL HIGH
Acid-base deficit: 8 — ABNORMAL HIGH
Bicarbonate: 12.7 — ABNORMAL LOW
Bicarbonate: 14.6 — ABNORMAL LOW
Bicarbonate: 17.6 — ABNORMAL LOW
Bicarbonate: 18.4 — ABNORMAL LOW
O2 Saturation: 95
O2 Saturation: 96
Operator id: 146571
Operator id: 280981
Operator id: 280981
Operator id: 280991
Patient temperature: 96.7
Patient temperature: 97
Patient temperature: 97.8
TCO2: 13
TCO2: 18
pCO2 arterial: 26.1 — ABNORMAL LOW
pCO2 arterial: 29 — ABNORMAL LOW
pH, Arterial: 7.371
pH, Arterial: 7.418 — ABNORMAL HIGH
pH, Arterial: 7.423 — ABNORMAL HIGH
pH, Arterial: 7.47 — ABNORMAL HIGH
pO2, Arterial: 58 — ABNORMAL LOW
pO2, Arterial: 63 — ABNORMAL LOW
pO2, Arterial: 69 — ABNORMAL LOW
pO2, Arterial: 77 — ABNORMAL LOW

## 2011-09-16 LAB — CARBOXYHEMOGLOBIN
Carboxyhemoglobin: 0.9
Carboxyhemoglobin: 1
Carboxyhemoglobin: 1
Methemoglobin: 1.8 — ABNORMAL HIGH
Methemoglobin: 1.8 — ABNORMAL HIGH
O2 Saturation: 94.2
O2 Saturation: 58.8
O2 Saturation: 60.5
Total hemoglobin: 12.3 — ABNORMAL LOW
Total hemoglobin: 12.8
Total hemoglobin: 13
Total hemoglobin: 11.1 — ABNORMAL LOW
Total oxygen content: 11.4 — ABNORMAL LOW
Total oxygen content: 11.7 — ABNORMAL LOW

## 2011-09-16 LAB — CULTURE, BLOOD (ROUTINE X 2)
Report Status: 3102009
Report Status: 3102009

## 2011-09-16 LAB — BASIC METABOLIC PANEL
BUN: 17
CO2: 15 — ABNORMAL LOW
CO2: 19
CO2: 20
Calcium: 6.2 — CL
Calcium: 6.7 — ABNORMAL LOW
Chloride: 101
Chloride: 109
Chloride: 113 — ABNORMAL HIGH
Chloride: 107
Creatinine, Ser: 1.49 — ABNORMAL HIGH
GFR calc Af Amer: 26 — ABNORMAL LOW
GFR calc Af Amer: 41 — ABNORMAL LOW
GFR calc Af Amer: 22 — ABNORMAL LOW
GFR calc Af Amer: 28 — ABNORMAL LOW
GFR calc non Af Amer: 26 — ABNORMAL LOW
GFR calc non Af Amer: 34 — ABNORMAL LOW
GFR calc non Af Amer: 18 — ABNORMAL LOW
GFR calc non Af Amer: 23 — ABNORMAL LOW
Glucose, Bld: 131 — ABNORMAL HIGH
Glucose, Bld: 151 — ABNORMAL HIGH
Glucose, Bld: 170 — ABNORMAL HIGH
Potassium: 3.8
Potassium: 4.1
Potassium: 3.4 — ABNORMAL LOW
Potassium: 3.8
Sodium: 133 — ABNORMAL LOW
Sodium: 137
Sodium: 134 — ABNORMAL LOW
Sodium: 135
Sodium: 137

## 2011-09-16 LAB — D-DIMER, QUANTITATIVE
D-Dimer, Quant: 2.84 — ABNORMAL HIGH
D-Dimer, Quant: 3.5 — ABNORMAL HIGH

## 2011-09-16 LAB — BLOOD GAS, ARTERIAL
Acid-base deficit: 9.6 — ABNORMAL HIGH
Bicarbonate: 15.6 — ABNORMAL LOW
Bicarbonate: 17 — ABNORMAL LOW
FIO2: 0.5
Patient temperature: 37
TCO2: 14.6
TCO2: 15.5
pCO2 arterial: 27.5 — ABNORMAL LOW
pCO2 arterial: 32.1 — ABNORMAL LOW
pCO2 arterial: 34.1 — ABNORMAL LOW
pH, Arterial: 7.284 — ABNORMAL LOW
pH, Arterial: 7.345 — ABNORMAL LOW
pO2, Arterial: 78.1 — ABNORMAL LOW

## 2011-09-16 LAB — PROTIME-INR
INR: 1.7 — ABNORMAL HIGH
INR: 2.1 — ABNORMAL HIGH
Prothrombin Time: 20.5 — ABNORMAL HIGH

## 2011-09-16 LAB — COMPREHENSIVE METABOLIC PANEL
ALT: 9
ALT: 13
AST: 19
Alkaline Phosphatase: 111
BUN: 27 — ABNORMAL HIGH
CO2: 18 — ABNORMAL LOW
CO2: 19
Chloride: 108
Creatinine, Ser: 1.9 — ABNORMAL HIGH
GFR calc Af Amer: 31 — ABNORMAL LOW
GFR calc non Af Amer: 26 — ABNORMAL LOW
GFR calc non Af Amer: 19 — ABNORMAL LOW
Glucose, Bld: 163 — ABNORMAL HIGH
Glucose, Bld: 239 — ABNORMAL HIGH
Potassium: 3.6
Sodium: 133 — ABNORMAL LOW
Total Bilirubin: 0.4
Total Protein: 3.7 — ABNORMAL LOW

## 2011-09-16 LAB — URINE MICROSCOPIC-ADD ON

## 2011-09-16 LAB — HEPATIC FUNCTION PANEL
ALT: 8
AST: 20
Albumin: 1.9 — ABNORMAL LOW
Alkaline Phosphatase: 61
Indirect Bilirubin: 0.2 — ABNORMAL LOW
Total Protein: 5.2 — ABNORMAL LOW

## 2011-09-16 LAB — URINALYSIS, ROUTINE W REFLEX MICROSCOPIC
Glucose, UA: NEGATIVE
Hgb urine dipstick: NEGATIVE
Hgb urine dipstick: NEGATIVE
Ketones, ur: 15 — AB
Nitrite: POSITIVE — AB
Nitrite: POSITIVE — AB
Protein, ur: 30 — AB
Specific Gravity, Urine: >1.03 — ABNORMAL HIGH
Specific Gravity, Urine: 1.025
Specific Gravity, Urine: 1.037 — ABNORMAL HIGH
Urobilinogen, UA: 0.2
Urobilinogen, UA: 1

## 2011-09-16 LAB — TYPE AND SCREEN
ABO/RH(D): A POS
Antibody Screen: NEGATIVE

## 2011-09-16 LAB — POCT I-STAT 3, VENOUS BLOOD GAS (G3P V)
O2 Saturation: 90
Operator id: 146571
pCO2, Ven: 24.3 — ABNORMAL LOW
pH, Ven: 7.447 — ABNORMAL HIGH
pO2, Ven: 53 — ABNORMAL HIGH

## 2011-09-16 LAB — POCT CARDIAC MARKERS
Myoglobin, poc: 165
Operator id: 189501
Troponin i, poc: <0.05

## 2011-09-16 LAB — URINE CULTURE: Colony Count: 80000

## 2011-09-16 LAB — CARDIAC PANEL(CRET KIN+CKTOT+MB+TROPI)
Relative Index: INVALID
Relative Index: INVALID
Total CK: 46

## 2011-09-16 LAB — ABO/RH: ABO/RH(D): A POS

## 2011-09-16 LAB — PATHOLOGIST SMEAR REVIEW

## 2011-09-16 LAB — PHOSPHORUS: Phosphorus: 2.2 — ABNORMAL LOW

## 2011-09-16 LAB — CALCIUM, IONIZED: Calcium, Ion: 1.04 — ABNORMAL LOW

## 2011-09-16 LAB — MAGNESIUM
Magnesium: 1.2 — ABNORMAL LOW
Magnesium: 1.4 — ABNORMAL LOW

## 2011-09-16 LAB — LACTIC ACID, PLASMA: Lactic Acid, Venous: 5.7 — ABNORMAL HIGH

## 2011-09-30 LAB — URINALYSIS, ROUTINE W REFLEX MICROSCOPIC
Bilirubin Urine: NEGATIVE
Glucose, UA: NEGATIVE
Hgb urine dipstick: NEGATIVE
Specific Gravity, Urine: 1.023
Urobilinogen, UA: 1

## 2011-09-30 LAB — BASIC METABOLIC PANEL
BUN: 14
Chloride: 105
Creatinine, Ser: 0.94
GFR calc non Af Amer: 58 — ABNORMAL LOW
Glucose, Bld: 103 — ABNORMAL HIGH
Potassium: 4.5

## 2011-09-30 LAB — URINE MICROSCOPIC-ADD ON

## 2011-09-30 LAB — HEMOGLOBIN AND HEMATOCRIT, BLOOD: Hemoglobin: 12.3

## 2011-10-01 LAB — COMPREHENSIVE METABOLIC PANEL
ALT: 25
AST: 27
Albumin: 3.9
Alkaline Phosphatase: 113
Chloride: 100
GFR calc Af Amer: >60
Potassium: 4.4
Sodium: 133 — ABNORMAL LOW
Total Bilirubin: 0.8
Total Protein: 7.3

## 2011-10-01 LAB — CBC
Platelets: 340
RDW: 16.1 — ABNORMAL HIGH
WBC: 7.8

## 2011-10-01 LAB — DIFFERENTIAL
Basophils Relative: 0
Eosinophils Relative: 5
Monocytes Absolute: 0.6
Monocytes Relative: 8
Neutro Abs: 5.6

## 2022-12-19 ENCOUNTER — Telehealth: Payer: Self-pay

## 2022-12-19 NOTE — Telephone Encounter (Signed)
error
